# Patient Record
Sex: Female | Born: 1960 | Race: White | Hispanic: No | Smoking: Current every day smoker
Health system: Southern US, Community
[De-identification: ages and names within clinical notes are randomized; demographics above are authoritative.]

## PROBLEM LIST (undated history)

## (undated) DIAGNOSIS — J40 Bronchitis, not specified as acute or chronic: Secondary | ICD-10-CM

## (undated) DIAGNOSIS — I491 Atrial premature depolarization: Secondary | ICD-10-CM

## (undated) DIAGNOSIS — R0789 Other chest pain: Secondary | ICD-10-CM

## (undated) DIAGNOSIS — E119 Type 2 diabetes mellitus without complications: Secondary | ICD-10-CM

## (undated) DIAGNOSIS — I4891 Unspecified atrial fibrillation: Secondary | ICD-10-CM

## (undated) DIAGNOSIS — R0989 Other specified symptoms and signs involving the circulatory and respiratory systems: Secondary | ICD-10-CM

## (undated) DIAGNOSIS — E785 Hyperlipidemia, unspecified: Secondary | ICD-10-CM

## (undated) DIAGNOSIS — R000 Tachycardia, unspecified: Secondary | ICD-10-CM

## (undated) HISTORY — DX: Bronchitis, not specified as acute or chronic: J40

## (undated) HISTORY — DX: Other chest pain: R07.89

## (undated) HISTORY — DX: Atrial premature depolarization: I49.1

## (undated) HISTORY — PX: NO PAST SURGERIES: SHX2092

## (undated) HISTORY — DX: Hyperlipidemia, unspecified: E78.5

## (undated) HISTORY — DX: Type 2 diabetes mellitus without complications: E11.9

## (undated) HISTORY — DX: Tachycardia, unspecified: R00.0

## (undated) HISTORY — DX: Other specified symptoms and signs involving the circulatory and respiratory systems: R09.89

## (undated) HISTORY — DX: Unspecified atrial fibrillation: I48.91

---

## 1997-12-14 ENCOUNTER — Other Ambulatory Visit: Admission: RE | Admit: 1997-12-14 | Discharge: 1997-12-14 | Payer: Self-pay | Admitting: Obstetrics and Gynecology

## 1998-01-11 ENCOUNTER — Ambulatory Visit (HOSPITAL_COMMUNITY): Admission: RE | Admit: 1998-01-11 | Discharge: 1998-01-11 | Payer: Self-pay | Admitting: Obstetrics and Gynecology

## 2001-04-01 ENCOUNTER — Emergency Department (HOSPITAL_COMMUNITY): Admission: EM | Admit: 2001-04-01 | Discharge: 2001-04-01 | Payer: Self-pay | Admitting: Emergency Medicine

## 2001-04-01 ENCOUNTER — Encounter: Payer: Self-pay | Admitting: Emergency Medicine

## 2003-03-27 ENCOUNTER — Other Ambulatory Visit: Admission: RE | Admit: 2003-03-27 | Discharge: 2003-03-27 | Payer: Self-pay | Admitting: Family Medicine

## 2005-11-01 ENCOUNTER — Emergency Department (HOSPITAL_COMMUNITY): Admission: EM | Admit: 2005-11-01 | Discharge: 2005-11-01 | Payer: Self-pay | Admitting: Emergency Medicine

## 2010-05-07 ENCOUNTER — Ambulatory Visit: Payer: Self-pay | Admitting: Dietician

## 2015-01-12 ENCOUNTER — Emergency Department (HOSPITAL_COMMUNITY)
Admission: EM | Admit: 2015-01-12 | Discharge: 2015-01-12 | Disposition: A | Discharge status: Home / Self Care | Payer: BC Managed Care – PPO | Attending: Emergency Medicine | Admitting: Emergency Medicine

## 2015-01-12 ENCOUNTER — Encounter (HOSPITAL_COMMUNITY): Payer: Self-pay

## 2015-01-12 ENCOUNTER — Other Ambulatory Visit: Payer: Self-pay

## 2015-01-12 ENCOUNTER — Emergency Department (HOSPITAL_COMMUNITY): Payer: BC Managed Care – PPO

## 2015-01-12 DIAGNOSIS — R000 Tachycardia, unspecified: Secondary | ICD-10-CM | POA: Diagnosis present

## 2015-01-12 DIAGNOSIS — Z79899 Other long term (current) drug therapy: Secondary | ICD-10-CM | POA: Insufficient documentation

## 2015-01-12 DIAGNOSIS — Z8709 Personal history of other diseases of the respiratory system: Secondary | ICD-10-CM | POA: Diagnosis not present

## 2015-01-12 DIAGNOSIS — I480 Paroxysmal atrial fibrillation: Secondary | ICD-10-CM | POA: Diagnosis not present

## 2015-01-12 DIAGNOSIS — Z720 Tobacco use: Secondary | ICD-10-CM | POA: Insufficient documentation

## 2015-01-12 DIAGNOSIS — F419 Anxiety disorder, unspecified: Secondary | ICD-10-CM | POA: Diagnosis not present

## 2015-01-12 LAB — BASIC METABOLIC PANEL
ANION GAP: 12 (ref 5–15)
BUN: 11 mg/dL (ref 6–20)
CALCIUM: 8.7 mg/dL — AB (ref 8.9–10.3)
CO2: 19 mmol/L — AB (ref 22–32)
Chloride: 107 mmol/L (ref 101–111)
Creatinine, Ser: 0.91 mg/dL (ref 0.44–1.00)
GLUCOSE: 118 mg/dL — AB (ref 65–99)
POTASSIUM: 3.8 mmol/L (ref 3.5–5.1)
Sodium: 138 mmol/L (ref 135–145)

## 2015-01-12 LAB — CBC
HEMATOCRIT: 37.4 % (ref 36.0–46.0)
HEMOGLOBIN: 12.1 g/dL (ref 12.0–15.0)
MCH: 29.2 pg (ref 26.0–34.0)
MCHC: 32.4 g/dL (ref 30.0–36.0)
MCV: 90.1 fL (ref 78.0–100.0)
Platelets: 494 10*3/uL — ABNORMAL HIGH (ref 150–400)
RBC: 4.15 MIL/uL (ref 3.87–5.11)
RDW: 14.4 % (ref 11.5–15.5)
WBC: 16.6 10*3/uL — ABNORMAL HIGH (ref 4.0–10.5)

## 2015-01-12 LAB — I-STAT TROPONIN, ED: TROPONIN I, POC: 0 ng/mL (ref 0.00–0.08)

## 2015-01-12 MED ORDER — DILTIAZEM HCL 25 MG/5ML IV SOLN
10.0000 mg | Freq: Once | INTRAVENOUS | Status: AC
  Administered 2015-01-12: 10 mg via INTRAVENOUS
  Filled 2015-01-12: qty 5

## 2015-01-12 MED ORDER — METOPROLOL TARTRATE 25 MG PO TABS: 100.0000 mg | ORAL_TABLET | Freq: Two times a day (BID) | ORAL | Status: DC | PRN

## 2015-01-12 NOTE — ED Notes (Signed)
She reports 2 week history of bronchitis symptoms and racing heart. She is being treated by her pcp for bronchitis but noticed today she felt her heart racing more so decided to come in. She denies any history of tachycardia. she is A&Ox4, resp e/u

## 2015-01-12 NOTE — Discharge Instructions (Signed)
Atrial Fibrillation °Atrial fibrillation is a type of heartbeat that is irregular or fast (rapid). If you have this condition, your heart keeps quivering in a weird (chaotic) way. This condition can make it so your heart cannot pump blood normally. Having this condition gives a person more risk for stroke, heart failure, and other heart problems. There are different types of atrial fibrillation. Talk with your doctor to learn about the type that you have. °HOME CARE °· Take over-the-counter and prescription medicines only as told by your doctor. °· If your doctor prescribed a blood-thinning medicine, take it exactly as told. Taking too much of it can cause bleeding. If you do not take enough of it, you will not have the protection that you need against stroke and other problems. °· Do not use any tobacco products. These include cigarettes, chewing tobacco, and e-cigarettes. If you need help quitting, ask your doctor. °· If you have apnea (obstructive sleep apnea), manage it as told by your doctor. °· Do not drink alcohol. °· Do not drink beverages that have caffeine. These include coffee, soda, and tea. °· Maintain a healthy weight. Do not use diet pills unless your doctor says they are safe for you. Diet pills may make heart problems worse. °· Follow diet instructions as told by your doctor. °· Exercise regularly as told by your doctor. °· Keep all follow-up visits as told by your doctor. This is important. °GET HELP IF: °· You notice a change in the speed, rhythm, or strength of your heartbeat. °· You are taking a blood-thinning medicine and you notice more bruising. °· You get tired more easily when you move or exercise. °GET HELP RIGHT AWAY IF: °· You have pain in your chest or your belly (abdomen). °· You have sweating or weakness. °· You feel sick to your stomach (nauseous). °· You notice blood in your throw up (vomit), poop (stool), or pee (urine). °· You are short of breath. °· You suddenly have swollen feet  and ankles. °· You feel dizzy. °· Your suddenly get weak or numb in your face, arms, or legs, especially if it happens on one side of your body. °· You have trouble talking, trouble understanding, or both. °· Your face or your eyelid droops on one side. °These symptoms may be an emergency. Do not wait to see if the symptoms will go away. Get medical help right away. Call your local emergency services (911 in the [place] Do not drive yourself to the hospital. °  °This information is not intended to replace advice given to you by your health care provider. Make sure you discuss any questions you have with your health care provider. °  °Document Released: 12/17/2007 Document Revised: 11/28/2014 Document Reviewed: 07/04/2014 °Elsevier Interactive Patient Education ©2016 [employer] Inc[employer] ° °

## 2015-01-12 NOTE — ED Notes (Signed)
Dr. Plunkett at bedside.  

## 2015-01-12 NOTE — ED Provider Notes (Addendum)
CSN: [id number]     Arrival date & time 01/12/15  1135 History   First MD Initiated Contact with Patient 01/12/15 1150     Chief Complaint  Patient presents with  . Tachycardia     (Consider location/radiation/quality/duration/timing/severity/associated sxs/prior Treatment) HPI Comments: Patient states 2 weeks ago she had symptoms consistent with bronchitis and she saw her regular doctor. At that time she was told her heart rate was elevated and that she needed to see a cardiologist but was also treated for bronchitis. Since that time she she had to return to her doctor last week for persistent URI symptoms. At that time she was given Levaquin which she states makes her sick and she's continued to have some chest pressure/congestion and palpitations. Today she felt palpitations and her pulse was elevated and she is Holly Park, in for further evaluation. She denies any productive cough at this time, fever, wheezing or shortness of breath. She states the bronchitis seems to be improving. She does drink 2 Pepsi's a day and was drinking a lot of caffeine but has cut back on her coffee. She denies any over-the-counter medications or stimulants. No prior cardiac issues in the past and has no chronic medical conditions. She does smoke.  Patient is a 54 y.o. female presenting with palpitations. The history is provided by the patient.  Palpitations Palpitations quality:  Fast Onset quality:  Insidious Duration:  2 weeks Timing:  Constant Progression:  Waxing and waning Chronicity:  New   History reviewed. No pertinent past medical history. History reviewed. No pertinent past surgical history. History reviewed. No pertinent family history. Social History  Substance Use Topics  . Smoking status: Current Every Day Smoker -- 0.50 packs/day    Types: Cigarettes  . Smokeless tobacco: None  . Alcohol Use: Yes     Comment: socially   OB History    No data available     Review of Systems   Cardiovascular: Positive for palpitations.  All other systems reviewed and are negative.     Allergies  Review of patient's allergies indicates no known allergies.  Home Medications   Prior to Admission medications   Medication Sig Start Date End Date Taking? Authorizing Provider  levofloxacin (LEVAQUIN) 500 MG tablet Take 500 mg by mouth daily.   Yes Historical Provider, MD   BP 95/53 mmHg  Pulse 82  Temp(Src) 97.9 F (36.6 C) (Oral)  Resp 30  Ht  (1.676 m)  Wt 166 lb (75.297 kg)  BMI 26.81 kg/m2  SpO2 96% Physical Exam  Constitutional: She is oriented to person, place, and time. She appears well-developed and well-nourished. No distress.  Slightly anxious on exam  HENT:  Head: Normocephalic and atraumatic.  Mouth/Throat: Oropharynx is clear and moist.  Eyes: Conjunctivae and EOM are normal. Pupils are equal, round, and reactive to light.  Neck: Normal range of motion. Neck supple.  Cardiovascular: Intact distal pulses.  An irregularly irregular rhythm present. Tachycardia present.   No murmur heard. Pulmonary/Chest: Effort normal and breath sounds normal. No respiratory distress. She has no wheezes. She has no rales.  Abdominal: Soft. She exhibits no distension. There is no tenderness. There is no rebound and no guarding.  Musculoskeletal: Normal range of motion. She exhibits no edema or tenderness.  Neurological: She is alert and oriented to person, place, and time.  Skin: Skin is warm and dry. No rash noted. No erythema.  Psychiatric: She has a normal mood and affect. Her behavior is normal.  Nursing  note and vitals reviewed.   ED Course  Procedures (including critical care time) Labs Review Labs Reviewed  BASIC METABOLIC PANEL - Abnormal; Notable for the following:    CO2 19 (*)    Glucose, Bld 118 (*)    Calcium 8.7 (*)    All other components within normal limits  CBC - Abnormal; Notable for the following:    WBC 16.6 (*)    Platelets 494 (*)     All other components within normal limits  Rosezena SensorI-STAT TROPOININ, ED    Imaging Review Dg Chest Port 1 View  01/12/2015  CLINICAL DATA:  Tachycardia EXAM: PORTABLE CHEST 1 VIEW COMPARISON:  None. FINDINGS: Normal heart size. Subsegmental atelectasis at the bases. Upper lungs clear. No pneumothorax or pleural effusion. IMPRESSION: Subsegmental atelectasis at the bases. Electronically Signed   By: Jolaine ClickArthur  Hoss M.D.   On: 01/12/2015 12:44   I have personally reviewed and evaluated these images and lab results as part of my medical decision-making.   EKG Interpretation   Date/Time:  Saturday January 12 2015 11:42:48 EDT Ventricular Rate:  172 PR Interval:    QRS Duration: 60 QT Interval:  214 QTC Calculation: 362 R Axis:   67 Text Interpretation:  Atrial fibrillation with rapid ventricular response  Cannot rule out Anterior infarct , age undetermined ST \\T \ T wave  abnormality, consider inferior ischemia No previous tracing Confirmed by  Anitra LauthPLUNKETT  MD, Colinda Barth ([id number]) on 01/12/2015 11:50:52 AM      MDM   Final diagnoses:  Paroxysmal atrial fibrillation with RVR (HCC)   patient here with EKG findings for A. fib RVR with intermittent sinus tachycardia that is now been intermittent for the last 2 weeks. When she was seen at her PCPs office she had EKG findings of sinus tachycardia with frequent PACs but no obvious atrial fibrillation. 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 When told by her regular doctor that she had a fast heart rate in need to follow-up with cardiology she cut back on her caffeine and is attempting to stop smoking. She complains of palpitations in the chest tightness and congestion which she often gets with bronchitis but the palpitations are new.  She denies any swelling, risk factors for PE drug or over-the-counter medication stimulant use.  After one dose of  Cardizem patient converted to normal sinus rhythm and has not had recurrent abnormality. Labs other than a leukocytosis of 16,000 of unknown significance are within normal limits. EKGs with some second segmental atelectasis at the bases but no findings of pneumonia. Troponin is within normal limits and patient has no physical exam findings of fluid overload.  Discussed with cardiology and they will see her in follow-up. Patient given metoprolol to take when necessary if palpitations return. She was given strict return precautions and this was discussed with she and her family who are comfortable with the plan.  Holly SproutWhitney Jamilla Galli, MD 01/12/15 1419  Holly SproutWhitney Obie Silos, MD 01/12/15 1422

## 2015-01-25 DIAGNOSIS — J40 Bronchitis, not specified as acute or chronic: Secondary | ICD-10-CM | POA: Insufficient documentation

## 2015-01-25 DIAGNOSIS — R0789 Other chest pain: Secondary | ICD-10-CM | POA: Insufficient documentation

## 2015-01-25 DIAGNOSIS — I4891 Unspecified atrial fibrillation: Secondary | ICD-10-CM | POA: Insufficient documentation

## 2015-01-25 DIAGNOSIS — I491 Atrial premature depolarization: Secondary | ICD-10-CM | POA: Insufficient documentation

## 2015-01-25 DIAGNOSIS — R0989 Other specified symptoms and signs involving the circulatory and respiratory systems: Secondary | ICD-10-CM | POA: Insufficient documentation

## 2015-01-25 DIAGNOSIS — R000 Tachycardia, unspecified: Secondary | ICD-10-CM | POA: Insufficient documentation

## 2015-01-29 ENCOUNTER — Encounter: Payer: Self-pay | Admitting: Cardiovascular Disease

## 2015-01-29 ENCOUNTER — Ambulatory Visit (INDEPENDENT_AMBULATORY_CARE_PROVIDER_SITE_OTHER): Payer: BC Managed Care – PPO | Admitting: Cardiovascular Disease

## 2015-01-29 VITALS — BP 106/74 | HR 85 | Ht 66.0 in | Wt 165.2 lb

## 2015-01-29 DIAGNOSIS — I480 Paroxysmal atrial fibrillation: Secondary | ICD-10-CM | POA: Diagnosis not present

## 2015-01-29 DIAGNOSIS — I4891 Unspecified atrial fibrillation: Secondary | ICD-10-CM | POA: Diagnosis not present

## 2015-01-29 LAB — CBC WITH DIFFERENTIAL/PLATELET
Basophils Absolute: 0 10*3/uL (ref 0.0–0.1)
Basophils Relative: 0 % (ref 0–1)
EOS PCT: 2 % (ref 0–5)
Eosinophils Absolute: 0.2 10*3/uL (ref 0.0–0.7)
HEMATOCRIT: 32.6 % — AB (ref 36.0–46.0)
Hemoglobin: 10.8 g/dL — ABNORMAL LOW (ref 12.0–15.0)
LYMPHS PCT: 29 % (ref 12–46)
Lymphs Abs: 3.1 10*3/uL (ref 0.7–4.0)
MCH: 28.4 pg (ref 26.0–34.0)
MCHC: 33.1 g/dL (ref 30.0–36.0)
MCV: 85.8 fL (ref 78.0–100.0)
MONO ABS: 1.3 10*3/uL — AB (ref 0.1–1.0)
MONOS PCT: 12 % (ref 3–12)
MPV: 8.1 fL — ABNORMAL LOW (ref 8.6–12.4)
Neutro Abs: 6.2 10*3/uL (ref 1.7–7.7)
Neutrophils Relative %: 57 % (ref 43–77)
Platelets: 492 10*3/uL — ABNORMAL HIGH (ref 150–400)
RBC: 3.8 MIL/uL — AB (ref 3.87–5.11)
RDW: 14.4 % (ref 11.5–15.5)
WBC: 10.8 10*3/uL — AB (ref 4.0–10.5)

## 2015-01-29 MED ORDER — PROPRANOLOL HCL 10 MG PO TABS: 10.0000 mg | ORAL_TABLET | Freq: Four times a day (QID) | ORAL | Status: DC | PRN

## 2015-01-29 NOTE — Patient Instructions (Signed)
Medication Instructions:  TAKE Propranolol 10 mg up to 4 times per day as needed   Labwork: TODAY - CBC, TSH   Testing/Procedures: Your physician has requested that you have an echocardiogram. Echocardiography is a painless test that uses sound waves to create images of your heart. It provides your doctor with information about the size and shape of your heart and how well your heart's chambers and valves are working. This procedure takes approximately one hour. There are no restrictions for this procedure.   Follow-Up: Your physician recommends that you schedule a follow-up appointment in: 3 months with Dr. Elease HashimotoNahser.  If you need a refill on your cardiac medications before your next appointment, please call your pharmacy.   Thank you for choosing [facility]! Holly BridegroomMichelle Tiffiny Worthy, RN [phone]

## 2015-01-29 NOTE — Progress Notes (Signed)
Cardiology Office Note   Date:  01/29/2015   ID:  Holly Park, DOB 13-Nov-1960, MRN [id number]  PCP:  Cala Bradford, MD  Cardiologist:   Vesta Mixer, MD   Chief Complaint  Patient presents with  . Atrial Fibrillation   Problem list 1. Paroxysmal atrial fibrillation   History of Present Illness: Holly Park is a 54 y.o. female who presents for further evaluation of her paroxysmal atrial fibrillation. Holly Park was recently seen in the emergency room with an episode of tachycardia. She was found have rapid atrial fibrillation.   She has had problems with bronchitis . Has been found to have an elevated WBC.      She received one dose of IV Cardizem and converted to normal sinus rhythm.  Has been placed on anxiety meds and is feeling better.   She has had PAF in the past .   She's been tried on metoprolol in the past but it caused her heart rate to go down to the 40s.    Past Medical History  Diagnosis Date  . Tachycardia   . New onset a-fib (HCC)   . Atrial fibrillation with RVR (HCC)   . Bronchitis   . PAC (premature atrial contraction)   . Chest tightness   . Chest congestion     Past Surgical History  Procedure Laterality Date  . No past surgeries       Current Outpatient Prescriptions  Medication Sig Dispense Refill  . escitalopram (LEXAPRO) 10 MG tablet Take 10 mg by mouth daily.  5   No current facility-administered medications for this visit.    Allergies:   Metoprolol tartrate    Social History:  The patient  reports that she has been smoking Cigarettes.  She has been smoking about 0.50 packs per day. She does not have any smokeless tobacco history on file. She reports that she drinks alcohol. She reports that she does not use illicit drugs.   Family History:  The patient's family history is not on file.    ROS:  Please see the history of present illness.    Review of Systems: Constitutional:  admits to  fatigue.  HEENT: denies  photophobia, eye pain, redness, hearing loss, ear pain, congestion, sore throat, rhinorrhea, sneezing, neck pain, neck stiffness and tinnitus.  Respiratory: admits to  DOE, cough, chest tightness,   Cardiovascular: admits to   palpitations    Gastrointestinal: denies nausea, vomiting, abdominal pain, diarrhea, constipation, blood in stool.  Genitourinary: denies dysuria, urgency, frequency, hematuria, flank pain and difficulty urinating.  Musculoskeletal: denies  myalgias, back pain, joint swelling, arthralgias and gait problem.   Skin: denies pallor, rash and wound.  Neurological: denies dizziness, seizures, syncope, weakness, light-headedness, numbness and headaches.   Hematological: denies adenopathy, easy bruising, personal or family bleeding history.  Psychiatric/ Behavioral: denies suicidal ideation, mood changes, confusion, nervousness, sleep disturbance and agitation.       All other systems are reviewed and negative.    PHYSICAL EXAM: VS:  BP 106/74 mmHg  Pulse 85  Ht  (1.676 m)  Wt 165 lb 3.2 oz (74.934 kg)  BMI 26.68 kg/m2  SpO2 96% , BMI Body mass index is 26.68 kg/(m^2). GEN: Well nourished, well developed, in no acute distress HEENT: normal Neck: no JVD, carotid bruits, or masses Cardiac: RRR; no murmurs, rubs, or gallops,no edema  Respiratory:  clear to auscultation bilaterally, normal work of breathing GI: soft, nontender, nondistended, + BS MS: no deformity or  atrophy Skin: warm and dry, no rash Neuro:  Strength and sensation are intact Psych: normal   EKG:  EKG is not ordered today. The ekg ordered  In the ER demonstrates  Rapid atrial fib followed by a 2nd ECG that shows NSR .   Recent Labs: 01/12/2015: BUN 11; Creatinine, Ser 0.91; Hemoglobin 12.1; Platelets 494*; Potassium 3.8; Sodium 138    Lipid Panel No results found for: CHOL, TRIG, HDL, CHOLHDL, VLDL, LDLCALC, LDLDIRECT    Wt Readings from Last 3 Encounters:  01/29/15 165 lb 3.2 oz  (74.934 kg)  01/12/15 166 lb (75.297 kg)      Other studies Reviewed: Additional studies/ records that were reviewed today include: . Review of the above records demonstrates:    ASSESSMENT AND PLAN:  1.  Paroxysmal atrial fibrillation:  Holly Park presents today with episodes of paroxysmal atrial fibrillation. She had one episode documented in the emergency room. She converted with one dose of Cardizem. Her CHADS2VASC score is 0.   We discussed the scoring system and that she does not need an anticoagulant at this time.  We discussed the importance of smoking cessation since respiratory issues can contribute to atrial fib.    Will give her Propranolol to take 10 mg as needed. Will see her in 3 months for follow up visit    Current medicines are reviewed at length with the patient today.  The patient does not have concerns regarding medicines.  The following changes have been made:  no change  Labs/ tests ordered today include: TSH, echocardiogram  No orders of the defined types were placed in this encounter.     Disposition:   FU with me in 3 months      Nahser, Deloris PingPhilip J, MD  01/29/2015 8:51 AM    [facility] [address], [place], [place]  [address] Phone: [phone]; Fax: [phone]   [facility] [place] [facility]  [address] [place], [place]  [address] [phone]   Fax [phone]

## 2015-01-31 ENCOUNTER — Other Ambulatory Visit: Payer: Self-pay

## 2015-01-31 ENCOUNTER — Ambulatory Visit (HOSPITAL_COMMUNITY): Payer: BC Managed Care – PPO | Attending: Cardiology

## 2015-01-31 ENCOUNTER — Other Ambulatory Visit: Payer: BC Managed Care – PPO | Admitting: *Deleted

## 2015-01-31 DIAGNOSIS — F17200 Nicotine dependence, unspecified, uncomplicated: Secondary | ICD-10-CM | POA: Insufficient documentation

## 2015-01-31 DIAGNOSIS — I4891 Unspecified atrial fibrillation: Secondary | ICD-10-CM | POA: Diagnosis present

## 2015-01-31 DIAGNOSIS — I480 Paroxysmal atrial fibrillation: Secondary | ICD-10-CM | POA: Diagnosis not present

## 2015-01-31 LAB — TSH: TSH: 1.983 u[IU]/mL (ref 0.350–4.500)

## 2015-05-01 ENCOUNTER — Ambulatory Visit: Payer: BC Managed Care – PPO | Admitting: Cardiovascular Disease

## 2015-05-09 ENCOUNTER — Encounter: Payer: Self-pay | Admitting: Cardiovascular Disease

## 2015-05-09 ENCOUNTER — Ambulatory Visit (INDEPENDENT_AMBULATORY_CARE_PROVIDER_SITE_OTHER): Payer: BC Managed Care – PPO | Admitting: Cardiovascular Disease

## 2015-05-09 VITALS — BP 106/76 | HR 70 | Ht 66.0 in | Wt 176.8 lb

## 2015-05-09 DIAGNOSIS — I4891 Unspecified atrial fibrillation: Secondary | ICD-10-CM | POA: Diagnosis not present

## 2015-05-09 NOTE — Progress Notes (Signed)
Cardiology Office Note   Date:  05/09/2015   ID:  Holly Park, DOB Sep 23, 1960, MRN [id number]  PCP:  Lolita Patella, Park  Cardiologist:   Vesta Mixer, Park   Chief Complaint  Patient presents with  . Atrial Fibrillation   Problem list 1. Paroxysmal atrial fibrillation   History of Present Illness: Holly Park is a 55 y.o. female who presents for further evaluation of her paroxysmal atrial fibrillation. Holly Park was recently seen in the emergency room with an episode of tachycardia. She was found have rapid atrial fibrillation.   She has had problems with bronchitis . Has been found to have an elevated WBC.      She received one dose of IV Cardizem and converted to normal sinus rhythm.  Has been placed on anxiety meds and is feeling better.   She has had PAF in the past .   She's been tried on metoprolol in the past but it caused her heart rate to go down to the 40s.  Feb. 16, 2017:  Doing great.   Has really cut back on her smoking . Is now retired -as of  16 days ago .   Walks regularly    Past Medical History  Diagnosis Date  . Tachycardia   . New onset a-fib (HCC)   . Atrial fibrillation with RVR (HCC)   . Bronchitis   . PAC (premature atrial contraction)   . Chest tightness   . Chest congestion     Past Surgical History  Procedure Laterality Date  . No past surgeries       Current Outpatient Prescriptions  Medication Sig Dispense Refill  . escitalopram (LEXAPRO) 10 MG tablet Take 10 mg by mouth daily.  5  . propranolol (INDERAL) 10 MG tablet Take 1 tablet (10 mg total) by mouth 4 (four) times daily as needed. (Patient not taking: Reported on 05/09/2015) 30 tablet 6   No current facility-administered medications for this visit.    Allergies:   Metoprolol tartrate    Social History:  The patient  reports that she has been smoking Cigarettes.  She has been smoking about 0.50 packs per day. She does not have any smokeless tobacco history  on file. She reports that she drinks alcohol. She reports that she does not use illicit drugs.   Family History:  The patient's family history is not on file.    ROS:  Please see the history of present illness.    Review of Systems: Constitutional:  admits to  fatigue.  HEENT: denies photophobia, eye pain, redness, hearing loss, ear pain, congestion, sore throat, rhinorrhea, sneezing, neck pain, neck stiffness and tinnitus.  Respiratory: admits to  DOE, cough, chest tightness,   Cardiovascular: admits to   palpitations    Gastrointestinal: denies nausea, vomiting, abdominal pain, diarrhea, constipation, blood in stool.  Genitourinary: denies dysuria, urgency, frequency, hematuria, flank pain and difficulty urinating.  Musculoskeletal: denies  myalgias, back pain, joint swelling, arthralgias and gait problem.   Skin: denies pallor, rash and wound.  Neurological: denies dizziness, seizures, syncope, weakness, light-headedness, numbness and headaches.   Hematological: denies adenopathy, easy bruising, personal or family bleeding history.  Psychiatric/ Behavioral: denies suicidal ideation, mood changes, confusion, nervousness, sleep disturbance and agitation.       All other systems are reviewed and negative.    PHYSICAL EXAM: VS:  BP 106/76 mmHg  Pulse 70  Ht  (1.676 m)  Wt 176 lb 12.8 oz (80.196 kg)  BMI 28.55 kg/m2 , BMI Body mass index is 28.55 kg/(m^2). GEN: Well nourished, well developed, in no acute distress HEENT: normal Neck: no JVD, carotid bruits, or masses Cardiac: RRR; no murmurs, rubs, or gallops,no edema  Respiratory:  clear to auscultation bilaterally, normal work of breathing GI: soft, nontender, nondistended, + BS MS: no deformity or atrophy Skin: warm and dry, no rash Neuro:  Strength and sensation are intact Psych: normal   EKG:  EKG is not ordered today. The ekg ordered  In the ER demonstrates  Rapid atrial fib followed by a 2nd ECG that shows NSR .    Recent Labs: 01/12/2015: BUN 11; Creatinine, Ser 0.91; Potassium 3.8; Sodium 138 01/29/2015: Hemoglobin 10.8*; Platelets 492* 01/31/2015: TSH 1.983    Lipid Panel No results found for: CHOL, TRIG, HDL, CHOLHDL, VLDL, LDLCALC, LDLDIRECT    Wt Readings from Last 3 Encounters:  05/09/15 176 lb 12.8 oz (80.196 kg)  01/29/15 165 lb 3.2 oz (74.934 kg)  01/12/15 166 lb (75.297 kg)      Other studies Reviewed: Additional studies/ records that were reviewed today include: . Review of the above records demonstrates:    ASSESSMENT AND PLAN:  1.  Paroxysmal atrial fibrillation:  She's doing very well. She's not had any recurrent episodes of atrial fibrillation. We'll continue with current medications. She's not on any medication.    Her CHADS2VASC score is 0.  " Up to Date" does not recommend ASA therapy any longer for atrial fib    Has not had to take any propranolol   Will see her in 1 year.   Current medicines are reviewed at length with the patient today.  The patient does not have concerns regarding medicines.  The following changes have been made:  no change  Labs/ tests ordered today include:  No orders of the defined types were placed in this encounter.    Disposition:   FU with me in 1 year     Holly Park  05/09/2015 10:38 AM    [facility] [address] [place], [place], [place]  [address] Phone: [phone]; Fax: [phone]   [facility]  [address] [place], [place]  [address] [phone]   Fax [phone]

## 2015-05-09 NOTE — Patient Instructions (Signed)
Medication Instructions:  Your physician recommends that you continue on your current medications as directed. Please refer to the Current Medication list given to you today.   Labwork: None Ordered   Testing/Procedures: None Ordered   Follow-Up: Your physician wants you to follow-up in: 1 year with Dr. Nahser.  You will receive a reminder letter in the mail two months in advance. If you don't receive a letter, please call our office to schedule the follow-up appointment.   If you need a refill on your cardiac medications before your next appointment, please call your pharmacy.   Thank you for choosing [facility]! Apostolos Blagg, RN [phone]    

## 2017-02-12 IMAGING — DX DG CHEST 1V PORT
1 series · 1 of 1 positions shown · non-contrast
Comparison: None.

CLINICAL DATA: Tachycardia

EXAM:
PORTABLE CHEST 1 VIEW

[chest ap]
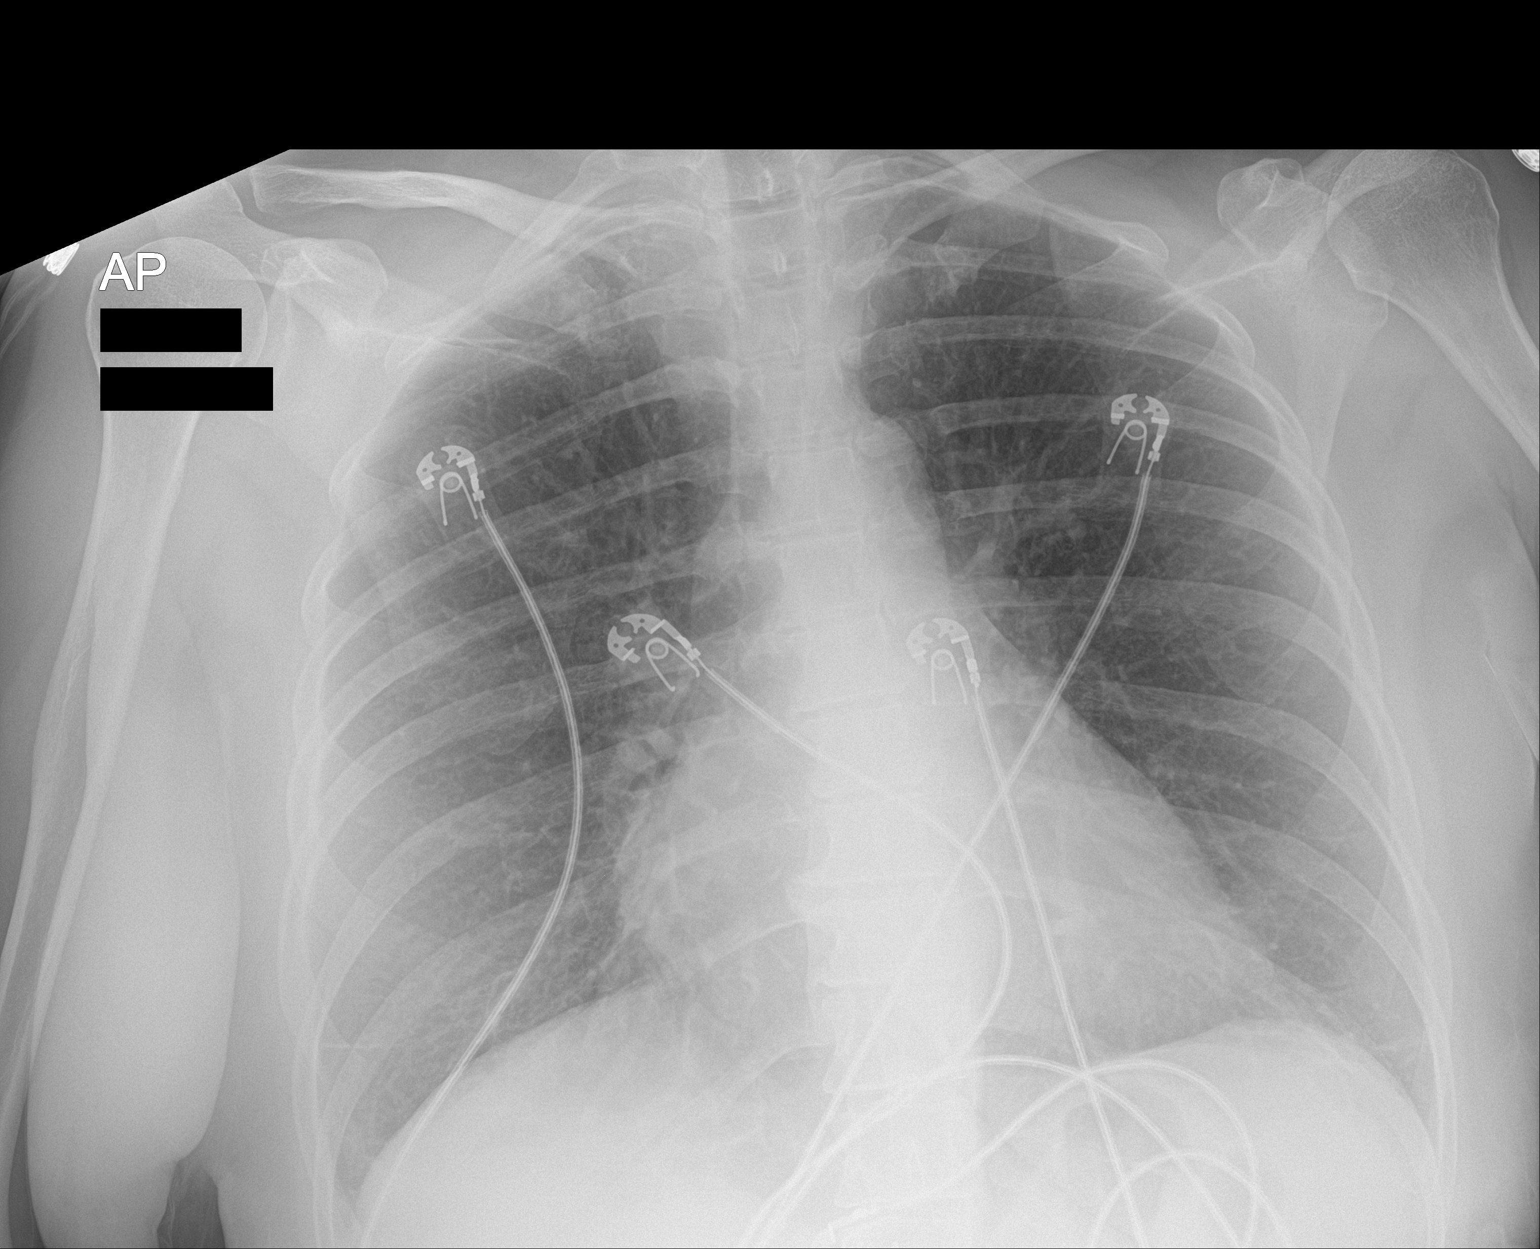

[1 of 1 positions shown; findings below may reference images not displayed]

FINDINGS: Normal heart size. Subsegmental atelectasis at the bases. Upper
lungs clear. No pneumothorax or pleural effusion.
IMPRESSION: Subsegmental atelectasis at the bases.

## 2017-08-20 ENCOUNTER — Other Ambulatory Visit: Payer: Self-pay | Admitting: Family Medicine

## 2017-08-20 DIAGNOSIS — R0989 Other specified symptoms and signs involving the circulatory and respiratory systems: Secondary | ICD-10-CM

## 2017-08-26 ENCOUNTER — Ambulatory Visit
Admission: RE | Admit: 2017-08-26 | Discharge: 2017-08-26 | Disposition: A | Discharge status: Home / Self Care | Payer: BC Managed Care – PPO | Source: Ambulatory Visit | Attending: Family Medicine | Admitting: Family Medicine

## 2017-08-26 DIAGNOSIS — R0989 Other specified symptoms and signs involving the circulatory and respiratory systems: Secondary | ICD-10-CM

## 2018-01-04 ENCOUNTER — Ambulatory Visit: Payer: BC Managed Care – PPO | Admitting: Vascular Surgery

## 2018-01-04 ENCOUNTER — Other Ambulatory Visit: Payer: Self-pay

## 2018-01-04 ENCOUNTER — Encounter: Payer: Self-pay | Admitting: Vascular Surgery

## 2018-01-04 VITALS — BP 103/72 | HR 76 | Temp 97.8°F | Resp 18 | Ht 66.0 in | Wt 170.4 lb

## 2018-01-04 DIAGNOSIS — I771 Stricture of artery: Secondary | ICD-10-CM

## 2018-01-04 NOTE — Progress Notes (Signed)
Vascular and Vein Specialist of   Patient name: Holly Park MRN: [id number] DOB: 05/15/1960 Sex: female  REASON FOR CONSULT: Evaluation of carotid and subclavian stenosis  HPI: Holly Park is a 57 y.o. female, who is today for discussion of abnormal carotid duplex.  She specifically denies any prior history of stroke.  She has no history of ischemic symptoms in her arms.  She does report occasional dizziness.  Had a episode of rapid atrial fibrillation which was associated with use of bronchodilators and this was converted by medication and she is maintaining sinus rhythm.  She was found to have carotid bruit and underwent duplex in June 2019.  I have this for review.  This did show moderate carotid disease bilaterally.  She had stenosis and both subclavian arteries.  She had retrograde flow in her left vertebral artery.  She reports that for some time is been difficult to remain obtain a pulse in her left arm.  Past Medical History:  Diagnosis Date  . Atrial fibrillation with RVR (HCC)   . Bronchitis   . Chest congestion   . Chest tightness   . Diabetes mellitus without complication (HCC)   . Hyperlipidemia   . New onset a-fib (HCC)   . PAC (premature atrial contraction)   . Tachycardia     Family History  Problem Relation Age of Onset  . Heart disease Mother   . Heart disease Sister     SOCIAL HISTORY: Social History   Socioeconomic History  . Marital status: Single    Spouse name: Not on file  . Number of children: Not on file  . Years of education: Not on file  . Highest education level: Not on file  Occupational History  . Not on file  Social Needs  . Financial resource strain: Not on file  . Food insecurity:    Worry: Not on file    Inability: Not on file  . Transportation needs:    Medical: Not on file    Non-medical: Not on file  Tobacco Use  . Smoking status: Current Every Day Smoker    Packs/day: 0.50      Years: 40.00    Pack years: 20.00    Types: Cigarettes  . Smokeless tobacco: Never Used  Substance and Sexual Activity  . Alcohol use: Yes    Comment: socially  . Drug use: No  . Sexual activity: Yes  Lifestyle  . Physical activity:    Days per week: Not on file    Minutes per session: Not on file  . Stress: Not on file  Relationships  . Social connections:    Talks on phone: Not on file    Gets together: Not on file    Attends religious service: Not on file    Active member of club or organization: Not on file    Attends meetings of clubs or organizations: Not on file    Relationship status: Not on file  . Intimate partner violence:    Fear of current or ex partner: Not on file    Emotionally abused: Not on file    Physically abused: Not on file    Forced sexual activity: Not on file  Other Topics Concern  . Not on file  Social History Narrative  . Not on file    Allergies  Allergen Reactions  . Metoprolol Tartrate     DROPPED HEART RATE INTO FOURTY'S.    Current Outpatient Medications  Medication Sig  Dispense Refill  . atorvastatin (LIPITOR) 20 MG tablet Take 20 mg by mouth daily.  1  . escitalopram (LEXAPRO) 10 MG tablet Take 10 mg by mouth daily.  5  . levocetirizine (XYZAL) 5 MG tablet every evening.  1  . propranolol (INDERAL) 10 MG tablet Take 1 tablet (10 mg total) by mouth 4 (four) times daily as needed. (Patient not taking: Reported on 01/04/2018) 30 tablet 6   No current facility-administered medications for this visit.     REVIEW OF SYSTEMS:  [X]  denotes positive finding, [ ]  denotes negative finding Cardiac  Comments:  Chest pain or chest pressure:    Shortness of breath upon exertion:    Short of breath when lying flat:    Irregular heart rhythm: x       Vascular    Pain in calf, thigh, or hip brought on by ambulation:    Pain in feet at night that wakes you up from your sleep:     Blood clot in your veins:    Leg swelling:          Pulmonary    Oxygen at home:    Productive cough:     Wheezing:         Neurologic    Sudden weakness in arms or legs:     Sudden numbness in arms or legs:     Sudden onset of difficulty speaking or slurred speech:    Temporary loss of vision in one eye:     Problems with dizziness:         Gastrointestinal    Blood in stool:     Vomited blood:         Genitourinary    Burning when urinating:     Blood in urine:        Psychiatric    Major depression:         Hematologic    Bleeding problems:    Problems with blood clotting too easily:        Skin    Rashes or ulcers:        Constitutional    Fever or chills:      PHYSICAL EXAM: Vitals:   01/04/18 1405 01/04/18 1411  BP: (!) 154/83 103/72  Pulse: 76   Resp: 18   Temp: 97.8 F (36.6 C)   TempSrc: Oral   SpO2: 96%   Weight: 170 lb 6.4 oz (77.3 kg)   Height: 5\' 6"  (1.676 m)     GENERAL: The patient is a well-nourished female, in no acute distress. The vital signs are documented above. CARDIOVASCULAR: I do not palpate a left radial pulse.  She has a 2+ right radial pulse.  I do not hear carotid or subclavian bruits.  She has easily palpable dorsalis pedis pulses bilaterally PULMONARY: There is good air exchange  ABDOMEN: Soft and non-tender  MUSCULOSKELETAL: There are no major deformities or cyanosis. NEUROLOGIC: No focal weakness or paresthesias are detected. SKIN: There are no ulcers or rashes noted. PSYCHIATRIC: The patient has a normal affect.  DATA:  Carotid duplex reviewed as above with probable occlusion of her proximal left subclavian with retrograde flow in her left vertebral artery  MEDICAL ISSUES: She is asymptomatic regarding this.  I reviewed symptoms of carotid disease with her and she knows to report immediately should this occur.  Also Splane symptoms of arm fatigue associated with her left subclavian occlusion.  She does have mild dizziness but this does not  appear to be related to her  posterior circulation.  We will continue to monitor this.  I did explain the critical importance of smoking cessation.  She is quite young to have this amount of diffuse disease.  Apparently she also reports that her sister had a major heart attack with congestive heart failure and is now on the transplant list.  Discussed her cardiac concerns with her cardiologist and her visit in 1 month.  We will see her again in 6 months for continued follow-up.  I have recommended CT angiogram for more thorough evaluation at that time.  She will notify should she develop any symptoms in the interim   Larina Earthly, MD [facility] Vascular and Vein Specialists of [facility] Tel [phone] Pager [phone]

## 2018-01-31 ENCOUNTER — Ambulatory Visit: Payer: BC Managed Care – PPO | Admitting: Internal Medicine

## 2018-02-04 ENCOUNTER — Ambulatory Visit: Payer: BC Managed Care – PPO | Admitting: Cardiovascular Disease

## 2018-05-12 ENCOUNTER — Encounter: Payer: Self-pay | Admitting: Cardiovascular Disease

## 2018-05-12 ENCOUNTER — Ambulatory Visit: Payer: BC Managed Care – PPO | Admitting: Cardiovascular Disease

## 2018-05-12 ENCOUNTER — Other Ambulatory Visit: Payer: Self-pay | Admitting: *Deleted

## 2018-05-12 VITALS — BP 140/82 | HR 82 | Ht 65.0 in | Wt 176.8 lb

## 2018-05-12 DIAGNOSIS — Z79899 Other long term (current) drug therapy: Secondary | ICD-10-CM | POA: Diagnosis not present

## 2018-05-12 DIAGNOSIS — I4891 Unspecified atrial fibrillation: Secondary | ICD-10-CM

## 2018-05-12 DIAGNOSIS — E781 Pure hyperglyceridemia: Secondary | ICD-10-CM

## 2018-05-12 MED ORDER — FENOFIBRATE 145 MG PO TABS: 145.0000 mg | ORAL_TABLET | Freq: Every day | ORAL | 3 refills | Status: AC

## 2018-05-12 NOTE — Progress Notes (Signed)
Cardiology Office Note   Date:  05/12/2018   ID:  [name], [name] 1960/07/01, MRN [id number]  PCP:  Wilfrid Lund, PA  Cardiologist:   Kristeen Miss, MD   Chief Complaint  Patient presents with  . Atrial Fibrillation   Problem list 1. Paroxysmal atrial fibrillation  Holly Park is a 58 y.o. female who presents for further evaluation of her paroxysmal atrial fibrillation. Holly Park was recently seen in the emergency room with an episode of tachycardia. She was found have rapid atrial fibrillation.   She has had problems with bronchitis . Has been found to have an elevated WBC.      She received one dose of IV Cardizem and converted to normal sinus rhythm.  Has been placed on anxiety meds and is feeling better.   She has had PAF in the past .   She's been tried on metoprolol in the past but it caused her heart rate to go down to the 40s.  Feb. 16, 2017:  Doing great.   Has really cut back on her smoking . Is now retired -as of  16 days ago .   Walks regularly   May 12, 2018:  Holly Park is seen back after a 2 year absence.  She has a history of paroxysmal atrial fibrillation. Was found to have carotid bruit. Also has a lower BP in her left arm Has seen Gretta Began, MD  She is trying to stop smoking.   She has bought nicotine patches.  Her sister has had a large ant MI and is now  He is planning on doing a CT angiogram   Last lipid levels show elevated triglyeride level Chol leves    Past Medical History:  Diagnosis Date  . Atrial fibrillation with RVR (HCC)   . Bronchitis   . Chest congestion   . Chest tightness   . Diabetes mellitus without complication (HCC)   . Hyperlipidemia   . New onset a-fib (HCC)   . PAC (premature atrial contraction)   . Tachycardia     Past Surgical History:  Procedure Laterality Date  . NO PAST SURGERIES       Current Outpatient Medications  Medication Sig Dispense Refill  . atorvastatin (LIPITOR) 20 MG tablet Take  20 mg by mouth daily.  1  . azithromycin (ZITHROMAX) 250 MG tablet TAKE 1 TABLET BY MOUTH EVERY DAY AS DIRECTED FOR 5 DAYS    . escitalopram (LEXAPRO) 10 MG tablet Take 10 mg by mouth daily.  5  . levocetirizine (XYZAL) 5 MG tablet Take 5 mg by mouth every evening.   1  . ONE TOUCH ULTRA TEST test strip     . propranolol (INDERAL) 10 MG tablet Take 10 mg by mouth 4 (four) times daily as needed.     No current facility-administered medications for this visit.     Allergies:   Metoprolol tartrate    Social History:  The patient  reports that she has been smoking cigarettes. She has a 20.00 pack-year smoking history. She has never used smokeless tobacco. She reports current alcohol use. She reports that she does not use drugs.   Family History:  The patient's family history includes Heart disease in her mother and sister.    ROS:  Please see the history of present illness.       Physical Exam: Blood pressure 140/82, pulse 82, height 5\' 5"  (1.651 m), weight 176 lb 12.8 oz (80.2 kg), SpO2 97 %.  GEN:  Well nourished, well developed in no acute distress HEENT: Normal NECK: No JVD; right carotid bruit  LYMPHATICS: No lymphadenopathy CARDIAC: RRR   RESPIRATORY:  Clear to auscultation without rales, wheezing or rhonchi  ABDOMEN: Soft, non-tender, non-distended MUSCULOSKELETAL:  No edema; No deformity  SKIN: Warm and dry NEUROLOGIC:  Alert and oriented x 3 l   EKG:   May 12, 2018: Normal sinus rhythm at 66 beats minute.  Normal EKG.  Recent Labs: No results found for requested labs within last 8760 hours.    Lipid Panel No results found for: CHOL, TRIG, HDL, CHOLHDL, VLDL, LDLCALC, LDLDIRECT    Wt Readings from Last 3 Encounters:  05/12/18 176 lb 12.8 oz (80.2 kg)  01/04/18 170 lb 6.4 oz (77.3 kg)  05/09/15 176 lb 12.8 oz (80.2 kg)      Other studies Reviewed: Additional studies/ records that were reviewed today include: . Review of the above records  demonstrates:    ASSESSMENT AND PLAN:  1.  Paroxysmal atrial fibrillation:   Not had any recurrent episodes of paroxysmal atrial fibrillation.  .  Peripheral vascular disease: The patient has a loud right carotid bruit.  Carotid duplex scan reveals bilateral mild to moderate rotted artery disease. He has already seen Dr. Arbie Cookey.  She has been found to have a left subclavian stenosis / occlusion  as well. He has scheduled her for a CT angiogram of the chest for further evaluation of her peripheral vascular disease.  He is interested in going ahead and getting the CT angiogram for further evaluation. At present she is asymptomatic and has good pulses in her left hand.  She has documented retrograde flow in the left vertebral artery.  We will have her follow-up with Dr. Arbie Cookey for further evaluation and management of her peripheral vascular disease.  We will try to make sure that her lipid levels are optimized.  We spent quite a bit of time talking about  smoking cessation.  3.  Hyperlipidemia: Her cholesterol levels are fairly normal.  Her triglyceride level remains elevated.  We will start her on fenofibrate 145 mg a day.  I encouraged her to greatly decreased the carbohydrates in her diet.   Current medicines are reviewed at length with the patient today.  The patient does not have concerns regarding medicines.  The following changes have been made:  no change  Labs/ tests ordered today include:  No orders of the defined types were placed in this encounter.   Disposition:   FU with me in 1 year     Kristeen Miss, MD  05/12/2018 9:09 AM    [facility] [address] [place], [place], [place]  [address] Phone: [phone]; Fax: [phone]   [facility]  [address] [place], [place]  [address] [phone]   Fax [phone]

## 2018-05-12 NOTE — Patient Instructions (Addendum)
Medication Instructions.. Start Fenofibrate 145mg  once a day  If you need a refill on your cardiac medications before your next appointment, please call your pharmacy.   Lab work: CMET and Lipid in 3 months... fasting   If you have labs (blood work) drawn today and your tests are completely normal, you will receive your results only by: Marland Kitchen MyChart Message (if you have MyChart) OR . A paper copy in the mail If you have any lab test that is abnormal or we need to change your treatment, we will call you to review the results.  Testing/Procedures: None ordered  Follow-Up: Your physician wants you to follow-up in: one year with Dr. Elease Hashimoto.Marland KitchenMarland KitchenYou will receive a reminder letter in the mail two months in advance. If you don't receive a letter, please call our office to schedule the follow-up appointment.   At [facility], you and your health needs are our priority.  As part of our continuing mission to provide you with exceptional heart care, we have created designated Provider Care Teams.  These Care Teams include your primary Cardiologist (physician) and Advanced Practice Providers (APPs -  Physician Assistants and Nurse Practitioners) who all work together to provide you with the care you need, when you need it.   Any Other Special Instructions Will Be Listed Below (If Applicable).  '

## 2018-07-24 IMAGING — US US CAROTID DUPLEX BILAT
1 series · 13 of 24 positions shown · non-contrast
Comparison: None.

CLINICAL DATA: Right neck bruit

EXAM:
BILATERAL CAROTID DUPLEX ULTRASOUND
TECHNIQUE: Gray scale imaging, color Doppler and duplex ultrasound were
performed of bilateral carotid and vertebral arteries in the neck.

[Series 1: us carotid duplex bilat · 0.06mm/px · 13 of 66 slices shown]
[im 1/66]
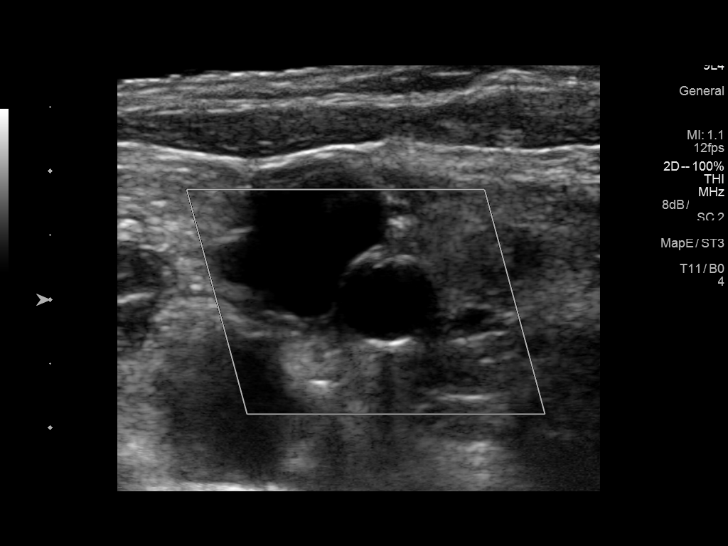
[im 6/66]
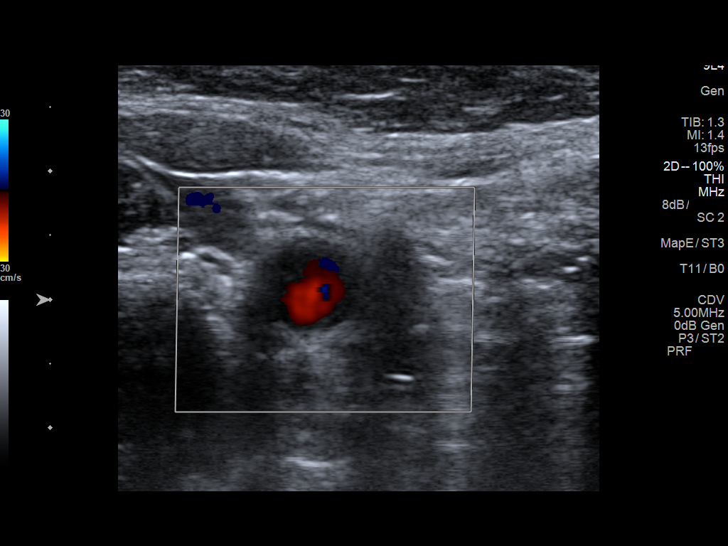
[im 12/66]
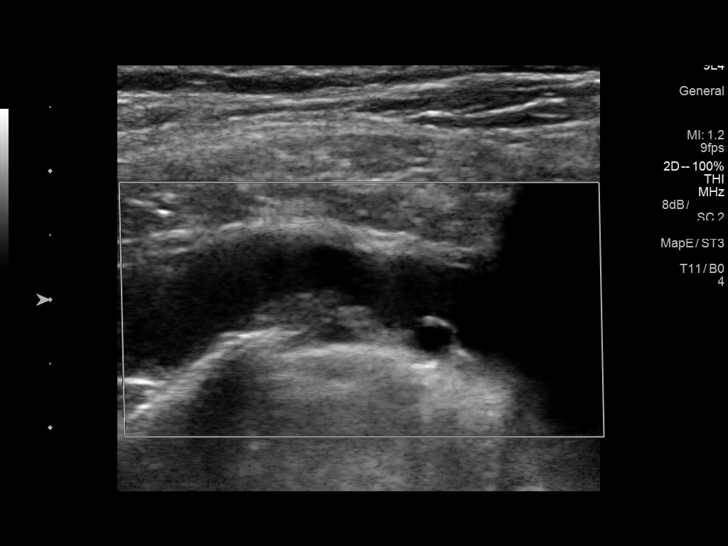
[im 17/66]
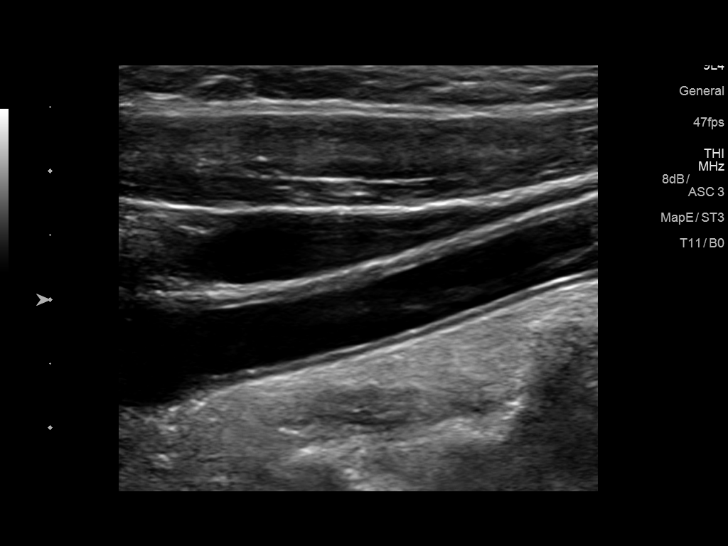
[im 23/66]
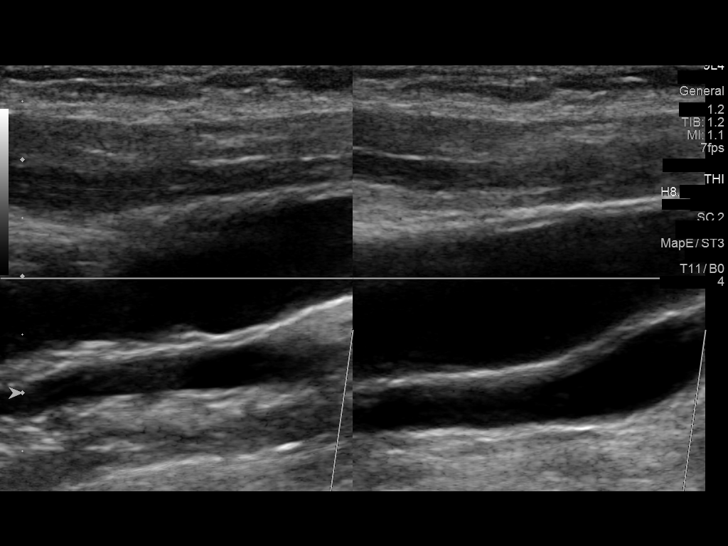
[im 29/66]
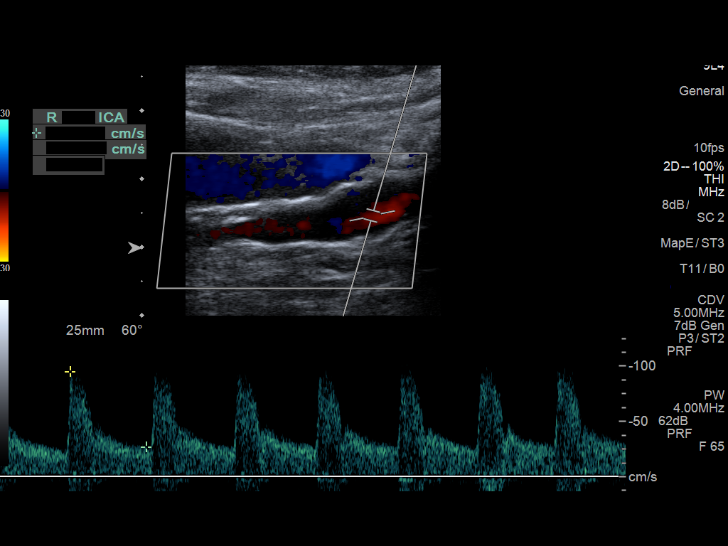
[im 34/66]
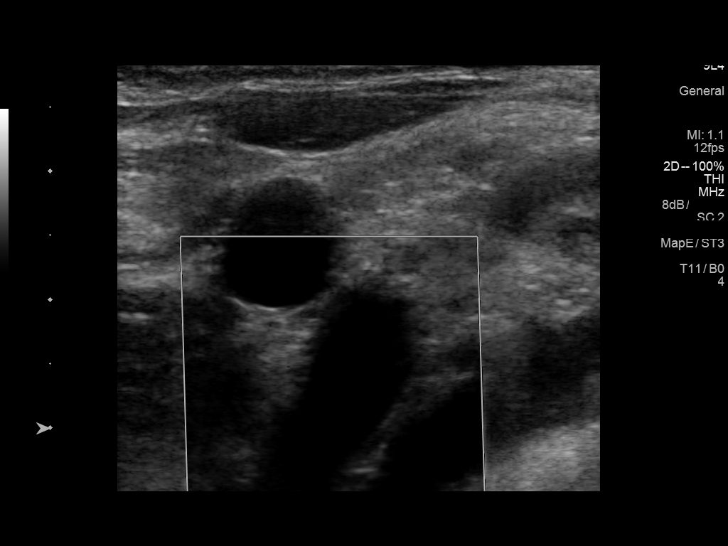
[im 37/66]
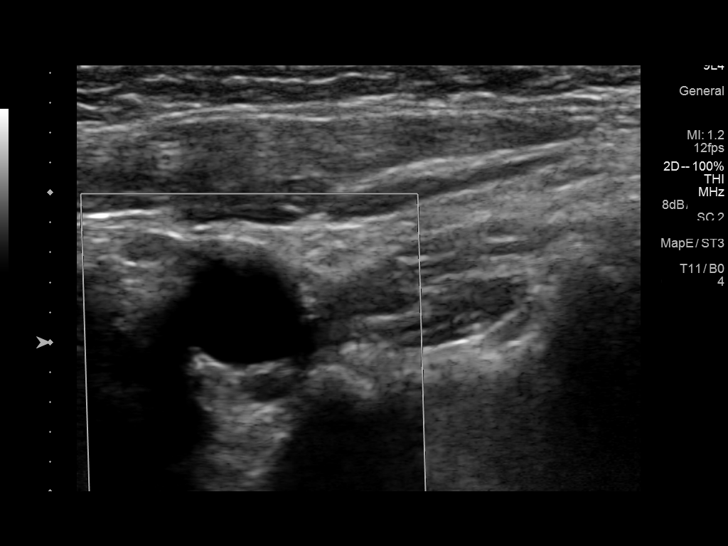
[im 43/66]
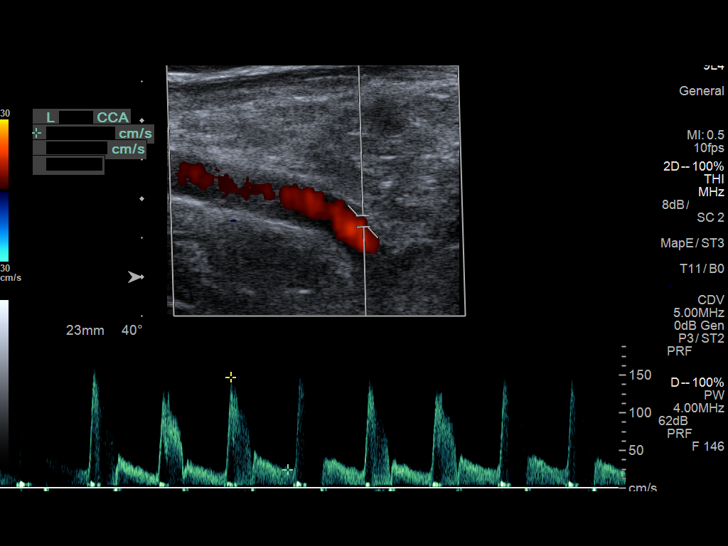
[im 49/66]
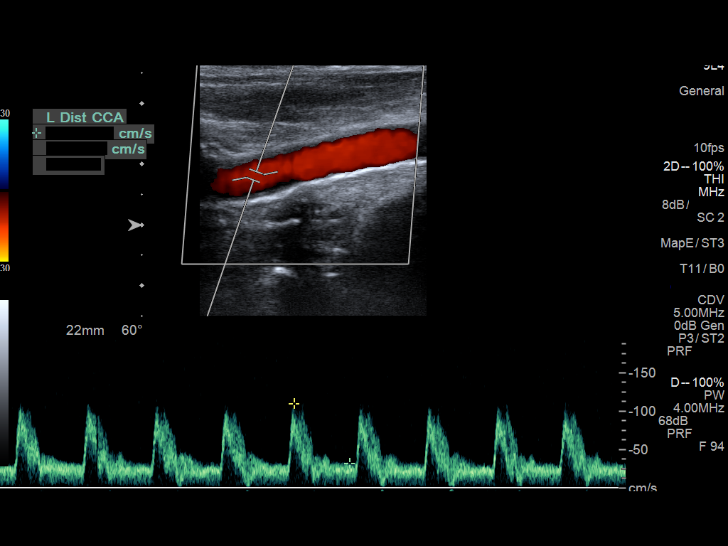
[im 54/66]
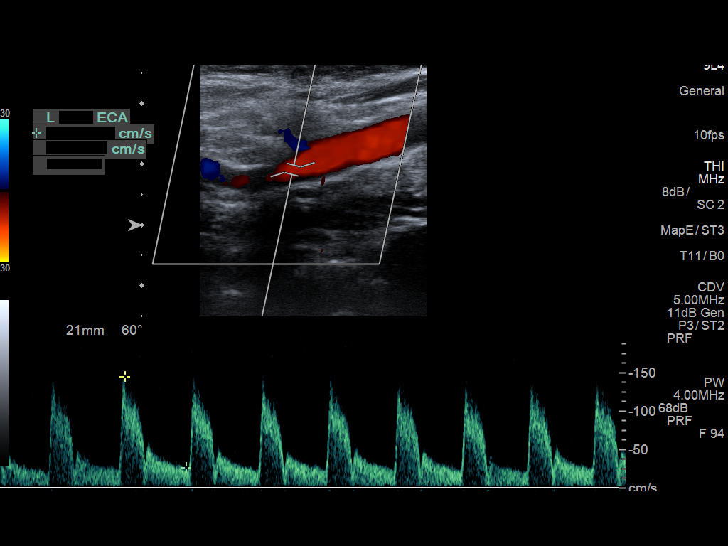
[im 60/66]
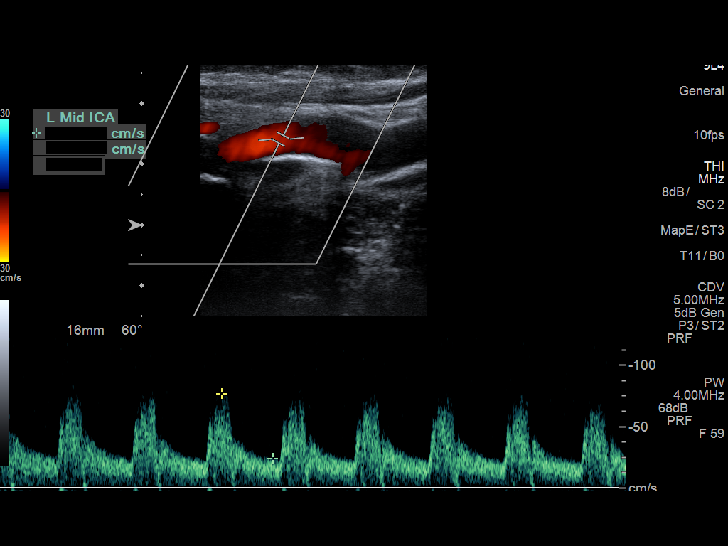
[im 66/66]
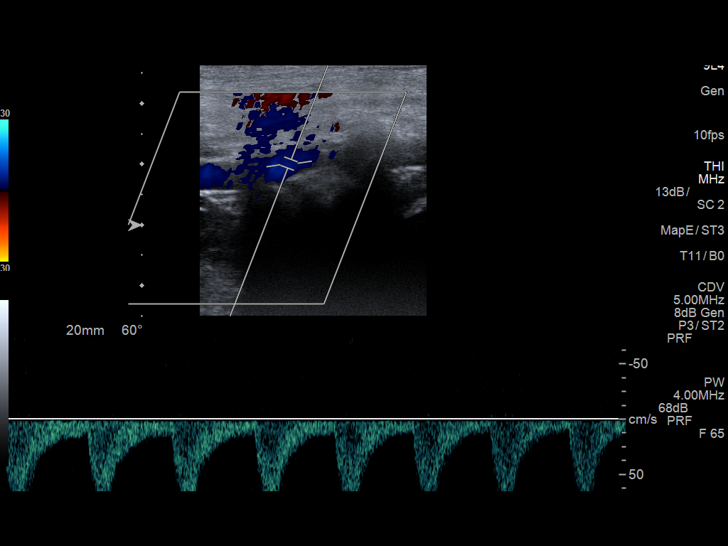

[13 of 24 positions shown; findings below may reference images not displayed]

FINDINGS: Criteria: Quantification of carotid stenosis is based on velocity
parameters that correlate the residual internal carotid diameter
with NASCET-based stenosis levels, using the diameter of the distal
internal carotid lumen as the denominator for stenosis measurement.

The following velocity measurements were obtained:

RIGHT

ICA:  95 cm/sec

CCA:  202 cm/sec

SYSTOLIC ICA/CCA RATIO:

DIASTOLIC ICA/CCA RATIO:

ECA:  93 cm/sec

LEFT

ICA:  87 cm/sec

CCA:  147 cm/sec

SYSTOLIC ICA/CCA RATIO:

DIASTOLIC ICA/CCA RATIO:

ECA:  145 cm/sec

RIGHT CAROTID ARTERY: Minimal intimal thickening in the bulb. Low
resistance internal carotid Doppler pattern is preserved

RIGHT VERTEBRAL ARTERY:  Antegrade.

RIGHT SUBCLAVIAN ARTERY: Abnormal monophasic low resistance pattern.
Maximal velocity is 347 centimeters/second.

LEFT CAROTID ARTERY: Minimal intimal thickening in the bulb. Low
resistance internal carotid Doppler pattern is preserved.

LEFT VERTEBRAL ARTERY:  Retrograde.

LEFT SUBCLAVIAN ARTERY: Abnormal monophasic waveform. Maximal
velocity is 44 centimeters/second.

Please note that the right and left upper extremity systolic blood
pressures are 134 and 83 mm Hg respectively.
IMPRESSION: Less than 50% stenosis in the right and left internal carotid
arteries.

Abnormal right subclavian artery Doppler waveform suggesting
proximal stenosis.

Abnormal left subclavian artery Doppler waveforms suggesting
proximal stenosis.

Retrograde flow in the left vertebral artery consistent with left
subclavian steal syndrome.

## 2018-08-10 ENCOUNTER — Other Ambulatory Visit: Payer: BC Managed Care – PPO

## 2019-01-05 ENCOUNTER — Other Ambulatory Visit: Payer: Self-pay | Admitting: Vascular Surgery

## 2019-01-05 DIAGNOSIS — I771 Stricture of artery: Secondary | ICD-10-CM

## 2019-01-18 ENCOUNTER — Ambulatory Visit
Admission: RE | Admit: 2019-01-18 | Discharge: 2019-01-18 | Disposition: A | Discharge status: Home / Self Care | Payer: BC Managed Care – PPO | Source: Ambulatory Visit | Attending: Vascular Surgery | Admitting: Vascular Surgery

## 2019-01-18 DIAGNOSIS — I771 Stricture of artery: Secondary | ICD-10-CM

## 2019-01-18 MED ORDER — IOPAMIDOL (ISOVUE-370) INJECTION 76%
75.0000 mL | Freq: Once | INTRAVENOUS | Status: AC | PRN
  Administered 2019-01-18: 12:00:00 75 mL via INTRAVENOUS

## 2019-01-20 ENCOUNTER — Telehealth: Payer: Self-pay | Admitting: *Deleted

## 2019-01-23 NOTE — Telephone Encounter (Signed)
Virtual Visit Pre-Appointment Phone Call  Today, I spoke with Holly Park and performed the following actions:  1. I explained that we are currently trying to limit exposure to the COVID-19 virus by seeing patients at home rather than in the office.  I explained that the visits are best done by video, but can be done by telephone.  I asked the patient if a virtual visit that the patient would like to try instead of coming into the office. Holly Park agreed to proceed with the virtual visit scheduled with Curt Jews MD on 01/24/19.     2. I confirmed the BEST phone number to call the day of the visit and- I included this in appointment notes.  3. I asked if the patient had access to (through a family member/friend) a smartphone with video capability to be used for her visit?"  The patient said yes -    4. I confirmed consent by  a. sending through [facility] or by email the [place] as written at the end of this message or  b. verbally as listed below. i. This visit is being performed in the setting of COVID-19. ii. All virtual visits are billed to your insurance company just like a normal visit would be.   iii. We'd like you to understand that the technology does not allow for your provider to perform an examination, and thus may limit your provider's ability to fully assess your condition.  iv. If your provider identifies any concerns that need to be evaluated in person, we will make arrangements to do so.   v. Finally, though the technology is pretty good, we cannot assure that it will always work on either your or our end, and in the setting of a video visit, we may have to convert it to a phone-only visit.  In either situation, we cannot ensure that we have a secure connection.   vi. Are you willing to proceed?"  STAFF: Did the patient verbally acknowledge consent to telehealth visit? Document YES/NO here: YES  2. I advised the patient to be  prepared - I asked that the patient, on the day of her visit, record any information possible with the equipment at her home, such as blood pressure, pulse, oxygen saturation, and your weight and write them all down. I asked the patient to have a pen and paper handy nearby the day of the visit as well.  3. If the patient was scheduled for a video visit, I informed the patient that the visit with the doctor would start with a text to the smartphone # given to [place] by the patient.         If the patient was scheduled for a telephone call, I informed the patient that the visit with the doctor would start with a call to the telephone # given to [place] by the patient.  4. I Informed patient they will receive a phone call 15 minutes prior to their appointment time from a [place] or nurse to review medications, allergies, etc. to prepare for the visit.    TELEPHONE CALL NOTE  Holly Park has been deemed a candidate for a follow-up tele-health visit to limit community exposure during the Covid-19 pandemic. I spoke with the patient via phone to ensure availability of phone/video source, confirm preferred email & phone number, and discuss instructions and expectations.  I reminded Holly Park to be prepared with any vital sign  and/or heart rhythm information that could potentially be obtained via home monitoring, at the time of her visit. I reminded Holly Park to expect a phone call prior to her visit.  Rudi Coco, NT 01/23/2019 12:52 PM     FULL LENGTH CONSENT FOR TELE-HEALTH VISIT   I hereby voluntarily request, consent and authorize [facility] and its employed or contracted physicians, physician assistants, nurse practitioners or other licensed health care professionals (the Practitioner), to provide me with telemedicine health care services (the "Services") as deemed necessary by the treating Practitioner. I acknowledge and consent to receive the Services by the Practitioner via  telemedicine. I understand that the telemedicine visit will involve communicating with the Practitioner through live audiovisual communication technology and the disclosure of certain medical information by electronic transmission. I acknowledge that I have been given the opportunity to request an in-person assessment or other available alternative prior to the telemedicine visit and am voluntarily participating in the telemedicine visit.  I understand that I have the right to withhold or withdraw my consent to the use of telemedicine in the course of my care at any time, without affecting my right to future care or treatment, and that the Practitioner or I may terminate the telemedicine visit at any time. I understand that I have the right to inspect all information obtained and/or recorded in the course of the telemedicine visit and may receive copies of available information for a reasonable fee.  I understand that some of the potential risks of receiving the Services via telemedicine include:  Marland Kitchen Delay or interruption in medical evaluation due to technological equipment failure or disruption; . Information transmitted may not be sufficient (e.g. poor resolution of images) to allow for appropriate medical decision making by the Practitioner; and/or  . In rare instances, security protocols could fail, causing a breach of personal health information.  Furthermore, I acknowledge that it is my responsibility to provide information about my medical history, conditions and care that is complete and accurate to the best of my ability. I acknowledge that Practitioner's advice, recommendations, and/or decision may be based on factors not within their control, such as incomplete or inaccurate data provided by me or distortions of diagnostic images or specimens that may result from electronic transmissions. I understand that the practice of medicine is not an exact science and that Practitioner makes no warranties or  guarantees regarding treatment outcomes. I acknowledge that I will receive a copy of this consent concurrently upon execution via email to the email address I last provided but may also request a printed copy by calling the office of [facility].    I understand that my insurance will be billed for this visit.   I have read or had this consent read to me. . I understand the contents of this consent, which adequately explains the benefits and risks of the Services being provided via telemedicine.  . I have been provided ample opportunity to ask questions regarding this consent and the Services and have had my questions answered to my satisfaction. . I give my informed consent for the services to be provided through the use of telemedicine in my medical care  By participating in this telemedicine visit I agree to the above.

## 2019-01-24 ENCOUNTER — Encounter: Payer: Self-pay | Admitting: Vascular Surgery

## 2019-01-24 ENCOUNTER — Ambulatory Visit (INDEPENDENT_AMBULATORY_CARE_PROVIDER_SITE_OTHER): Payer: BC Managed Care – PPO | Admitting: Vascular Surgery

## 2019-01-24 ENCOUNTER — Other Ambulatory Visit: Payer: Self-pay

## 2019-01-24 DIAGNOSIS — I771 Stricture of artery: Secondary | ICD-10-CM

## 2019-01-24 NOTE — Progress Notes (Signed)
Virtual Visit via Telephone Note    I connected with Holly Park on 01/24/2019  by telephone and verified that I was speaking with the correct person using two identifiers. Patient was located at home and accompanied by no one. I am located at [facility].   The limitations of evaluation and management by telemedicine and the availability of in person appointments have been previously discussed with the patient and are documented in the patients chart. The patient expressed understanding and consented to proceed.  PCP: Lois Huxley, PA   Chief Complaint: Abnormal subclavian artery  History of Present Illness: Holly Park is a 58 y.o. female with known history of left subclavian artery occlusion.  I had seen her 1 year ago with duplex.  She had been found to have a carotid bruit and duplex showed left subclavian occlusion with retrograde flow in her left vertebral artery.  It also suggested possible carotid moderate stenosis bilaterally.  I recommended follow-up with a CT scan at 6 months.  It is actually been 1 year since I saw her.  She has had no neurologic deficits.  She does report occasional dizziness when she arises quickly but this is easily tolerated.  She has no symptoms of fatigue in her left arm.  He has no new cardiac difficulties.  She reports that her sisters continue to have difficulty at an Frankey Botting age and is recently had an LVAD placement  Past Medical History:  Diagnosis Date  . Atrial fibrillation with RVR ([place])   . Bronchitis   . Chest congestion   . Chest tightness   . Diabetes mellitus without complication ([place])   . Hyperlipidemia   . New onset a-fib ([place])   . PAC (premature atrial contraction)   . Tachycardia     Past Surgical History:  Procedure Laterality Date  . NO PAST SURGERIES      Current Meds  Medication Sig  . aspirin EC 81 MG tablet Take 81 mg by mouth daily.  Marland Kitchen atorvastatin (LIPITOR) 20 MG tablet Take 20 mg by mouth daily.  Marland Kitchen  escitalopram (LEXAPRO) 10 MG tablet Take 10 mg by mouth daily.  . fenofibrate (TRICOR) 145 MG tablet Take 1 tablet (145 mg total) by mouth daily.  . fluticasone (FLONASE) 50 MCG/ACT nasal spray SMARTSIG:2 Spray(s) Both Nares Every Night  . levocetirizine (XYZAL) 5 MG tablet Take 5 mg by mouth every evening.   . ONE TOUCH ULTRA TEST test strip     12 system ROS was negative unless otherwise noted in HPI   Observations/Objective: She underwent a CT scan on 01/18/2019.  I reviewed the images and discussed these in detail with the patient.  This does show occlusion of her left subclavian artery distal to the takeoff.  She does have flow in her left vertebral artery presumably retrograde by CT.  She does not have any evidence of significant carotid disease bilaterally.  She does have mild narrowing of the origin of her right vertebral artery  Assessment and Plan:  Stable CT scan compared to duplex from 1 year ago.  She has had no symptoms of left arm ischemia and no evidence of posterior circulation difficulties.  I would not recommend any further evaluation and would not recommend follow-up unless she develops new symptoms      I discussed the assessment and treatment plan with the patient. The patient was provided an opportunity to ask questions and all were answered. The patient agreed with the  plan and demonstrated an understanding of the instructions.   The patient was advised to call back or seek an in-person evaluation if the symptoms worsen or if the condition fails to improve as anticipated.  I spent 15 minutes with the patient via telephone encounter including review of films.   Milagros Loll [facility] Office: [phone]  01/24/2019, 12:03 PM

## 2019-07-14 ENCOUNTER — Ambulatory Visit: Payer: BC Managed Care – PPO | Admitting: Cardiovascular Disease

## 2019-07-27 ENCOUNTER — Ambulatory Visit: Payer: BC Managed Care – PPO | Admitting: Physician Assistant

## 2019-07-27 ENCOUNTER — Other Ambulatory Visit: Payer: Self-pay

## 2019-07-27 ENCOUNTER — Encounter: Payer: Self-pay | Admitting: Physician Assistant

## 2019-07-27 VITALS — BP 142/90 | HR 83 | Ht 65.0 in | Wt 177.8 lb

## 2019-07-27 DIAGNOSIS — I480 Paroxysmal atrial fibrillation: Secondary | ICD-10-CM | POA: Diagnosis not present

## 2019-07-27 DIAGNOSIS — Z720 Tobacco use: Secondary | ICD-10-CM

## 2019-07-27 DIAGNOSIS — E781 Pure hyperglyceridemia: Secondary | ICD-10-CM

## 2019-07-27 DIAGNOSIS — R030 Elevated blood-pressure reading, without diagnosis of hypertension: Secondary | ICD-10-CM

## 2019-07-27 DIAGNOSIS — R0609 Other forms of dyspnea: Secondary | ICD-10-CM

## 2019-07-27 DIAGNOSIS — R0600 Dyspnea, unspecified: Secondary | ICD-10-CM

## 2019-07-27 DIAGNOSIS — I771 Stricture of artery: Secondary | ICD-10-CM | POA: Diagnosis not present

## 2019-07-27 NOTE — Progress Notes (Signed)
Cardiology Office Note    Date:  07/27/2019   ID:  Holly Park, DOB [birth year], MRN [id number]  PCP:  Wilfrid Lund, PA  Cardiologist:  Dr. Elease Hashimoto  Chief Complaint: 15  Months follow up  History of Present Illness:   Holly Park is a 59 y.o. female with history of paroxysmal atrial fibrillation,  left subclavian stenosis followed by Dr. Arbie Cookey, ongoing tobacco smoking and hyperlipidemia presents for follow-up.  Dx with afib in 2016 in setting of bronchitis. Patient was found to have carotid bruit in 2019.  Follow-up carotid Doppler in 2019  showed left subclavian occlusion with retrograde Park in the left vertebral artery.  There was possible moderate carotid arterial disease bilaterally.  She established care with Dr. Arbie Cookey, last seen 01/2019.  She underwent CT angio of neck prior to appointment which showed occlusion of left subclavian artery distal to takeoff.  Stable scan. "She does not have any evidence of significant carotid disease bilaterally.  She does have mild narrowing of the origin of her right vertebral artery".  No evidence of left arm ischemia or posterior circulation.  No work-up recommended unless symptomatic.  Here today for follow-up.  Since pandemic patient has started smoking more than usual.  Currently smoking little less than 1 pack a day.  Patient reports shortness of breath with extreme walking which resolved with rest and catching breath.  No exertional chest discomfort.  Denies palpitation, orthopnea, PND, lower extremity edema, melena or blood in her stool or urine.  Sister had MI at age 11 and requiring LVAD at age 53. Maternal grandfather and maternal uncle has pacemaker.  Past Medical History:  Diagnosis Date  . Atrial fibrillation with RVR ([facility])   . Bronchitis   . Chest congestion   . Chest tightness   . Diabetes mellitus without complication ([facility])   . Hyperlipidemia   . New onset a-fib ([facility])   . PAC (premature atrial contraction)   .  Tachycardia     Past Surgical History:  Procedure Laterality Date  . NO PAST SURGERIES      Current Medications: Prior to Admission medications   Medication Sig Start Date End Date Taking? Authorizing Provider  aspirin EC 81 MG tablet Take 81 mg by mouth daily.    [provider]  atorvastatin (LIPITOR) 20 MG tablet Take 20 mg by mouth daily. 12/03/17   [provider]  escitalopram (LEXAPRO) 10 MG tablet Take 10 mg by mouth daily. 01/15/15   [provider]  fenofibrate (TRICOR) 145 MG tablet Take 1 tablet (145 mg total) by mouth daily. 05/12/18   Pricilla Riffle, MD  fluticasone [facility]) 50 MCG/ACT nasal spray SMARTSIG:2 Spray(s) Both Nares Every Night 12/07/18   [provider]  levocetirizine (XYZAL) 5 MG tablet Take 5 mg by mouth every evening.  11/12/17   [provider]  ONE TOUCH ULTRA TEST test strip  02/28/18   [provider]    Allergies:   Metoprolol tartrate   Social History   Socioeconomic History  . Marital status: Single    Spouse name: Not on file  . Number of children: Not on file  . Years of education: Not on file  . Highest education level: Not on file  Occupational History  . Not on file  Tobacco Use  . Smoking status: Current Every Day Smoker    Packs/day: 0.50    Years: 40.00    Pack years: 20.00    Types: Cigarettes  .  Smokeless tobacco: Never Used  Substance and Sexual Activity  . Alcohol use: Yes    Comment: socially  . Drug use: No  . Sexual activity: Yes  Other Topics Concern  . Not on file  Social History Narrative  . Not on file   Social Determinants of Health   Financial Resource Strain:   . Difficulty of Paying Living Expenses:   Food Insecurity:   . Worried About Programme researcher, broadcasting/film/video in the Last Year:   . Barista in the Last Year:   Transportation Needs:   . Freight forwarder (Medical):   Marland Kitchen Lack of Transportation (Non-Medical):   Physical Activity:   . Days of  Exercise per Week:   . Minutes of Exercise per Session:   Stress:   . Feeling of Stress :   Social Connections:   . Frequency of Communication with Friends and Family:   . Frequency of Social Gatherings with Friends and Family:   . Attends Religious Services:   . Active Member of Clubs or Organizations:   . Attends Banker Meetings:   Marland Kitchen Marital Status:      Family History:  The patient's family history includes Heart disease in her mother and sister.   ROS:   Please see the history of present illness.    ROS All other systems reviewed and are negative.   PHYSICAL EXAM:   VS:  BP (!) 142/90   Pulse 83   Ht 5\' 5"  (1.651 m)   Wt 177 lb 12.8 oz (80.6 kg)   BMI 29.59 kg/m    GEN: Well nourished, well developed, in no acute distress  HEENT: normal  Neck: no JVD, carotid bruits, or masses Cardiac:RRR; no murmurs, rubs, or gallops,no edema  Respiratory:  clear to auscultation bilaterally, normal work of breathing GI: soft, nontender, nondistended, + BS MS: no deformity or atrophy  Skin: warm and dry, no rash Neuro:  Alert and Oriented x 3, Strength and sensation are intact Psych: euthymic mood, full affect  Wt Readings from Last 3 Encounters:  07/27/19 177 lb 12.8 oz (80.6 kg)  05/12/18 176 lb 12.8 oz (80.2 kg)  01/04/18 170 lb 6.4 oz (77.3 kg)      Studies/Labs Reviewed:   EKG:  EKG is ordered today.  The ekg ordered today demonstrates normal sinus rhythm at rate of 83 bpm, PVC  Recent Labs: No results found for requested labs within last 8760 hours.   Lipid Panel No results found for: CHOL, TRIG, HDL, CHOLHDL, VLDL, LDLCALC, LDLDIRECT  Additional studies/ records that were reviewed today include:   CT angio of neck 12/2018 IMPRESSION: 1. Occlusion of the left subclavian artery 10 mm from its origin. 2. The left subclavian artery is reconstituted just proximal to the vertebral artery, consistent with subclavian steal syndrome. 3. Mild  atherosclerotic changes at the left carotid bifurcation without significant stenosis. 4. 50% stenosis at the origin of the right vertebral artery.   Carotid doppler 08/2017 IMPRESSION: Less than 50% stenosis in the right and left internal carotid arteries.  Abnormal right subclavian artery Doppler waveform suggesting proximal stenosis.  Abnormal left subclavian artery Doppler waveforms suggesting proximal stenosis.  Retrograde Park in the left vertebral artery consistent with left subclavian steal syndrome.  Echo 01/2015 Left ventricle: The cavity size was normal. Wall thickness was  normal. Systolic function was normal. The estimated ejection  fraction was in the range of 50% to 55%. Wall motion was normal;  there were no regional wall motion abnormalities. Left  ventricular diastolic function parameters were normal.   ASSESSMENT & PLAN:    1. Paroxysmal atrial fibrillation No recurrence since 2016.  CHA2DS2-VASc score of 1 for sex.  Continue aspirin and statin therapy.  2.  Left subclavian artery stenosis -CT scan October 2020 showed occlusion of left subclavian artery stenosis without significant carotid artery disease bilaterally. Seen by Dr. Donnetta Hutching 01/2019 and did not recommended follow up scan unless symptomatic.   3. HLD -LDL 87 on October 2020.  Continue Lipitor.  Followed by PCP.  4.  Elevated blood pressure reading -Initial blood pressure of 142/90.  Repeat check at the end of visit by myself 130/78. -Advised to periodically check blood pressure.  5.  Tobacco smoking -Recommended smoking cessation. -Does not want to try nicotine patch or gum -She is  thinking about Chantix and let us know if interested  6.  Exertional shortness of breath -Likely due to ongoing smoking.  No exertional chest pressure or tightness.  She has significant cardiac risk factors.  She will let us know if worsening of symptoms.  Discussed in length.    Medication  Adjustments/Labs and Tests Ordered: Current medicines are reviewed at length with the patient today.  Concerns regarding medicines are outlined above.  Medication changes, Labs and Tests ordered today are listed in the Patient Instructions below. Patient Instructions  Medication Instructions:  Your physician recommends that you continue on your current medications as directed. Please refer to the Current Medication list given to you today.  *If you need a refill on your cardiac medications before your next appointment, please call your pharmacy*   Lab Work: None ordered  If you have labs (blood work) drawn today and your tests are completely normal, you will receive your results only by: Marland Kitchen MyChart Message (if you have MyChart) OR . A paper copy in the mail If you have any lab test that is abnormal or we need to change your treatment, we will call you to review the results.   Testing/Procedures: None ordered   Follow-Up: At [facility], you and your health needs are our priority.  As part of our continuing mission to provide you with exceptional heart care, we have created designated Provider Care Teams.  These Care Teams include your primary Cardiologist (physician) and Advanced Practice Providers (APPs -  Physician Assistants and Nurse Practitioners) who all work together to provide you with the care you need, when you need it.  We recommend signing up for the patient portal called "MyChart".  Sign up information is provided on this After Visit Summary.  MyChart is used to connect with patients for Virtual Visits (Telemedicine).  Patients are able to view lab/test results, encounter notes, upcoming appointments, etc.  Non-urgent messages can be sent to your provider as well.   To learn more about what you can do with MyChart, go to [email or web address].    Your next appointment:   6 month(s)  The format for your next appointment:   In Person  Provider:   You may see Dr.  Mertie Moores  or one of the following Advanced Practice Providers on your designated Care Team:    Richardson Dopp, PA-C  Robbie Lis, PA-C    Other Instructions      Signed, Leanor Kail, [place]  07/27/2019 4:07 PM    [address], [place], [place]  [address] Phone: [phone]; Fax: [phone]

## 2019-07-27 NOTE — Patient Instructions (Addendum)
Medication Instructions:  Your physician recommends that you continue on your current medications as directed. Please refer to the Current Medication list given to you today.  *If you need a refill on your cardiac medications before your next appointment, please call your pharmacy*   Lab Work: None ordered  If you have labs (blood work) drawn today and your tests are completely normal, you will receive your results only by: . MyChart Message (if you have MyChart) OR . A paper copy in the mail If you have any lab test that is abnormal or we need to change your treatment, we will call you to review the results.   Testing/Procedures: None ordered   Follow-Up: At [facility], you and your health needs are our priority.  As part of our continuing mission to provide you with exceptional heart care, we have created designated Provider Care Teams.  These Care Teams include your primary Cardiologist (physician) and Advanced Practice Providers (APPs -  Physician Assistants and Nurse Practitioners) who all work together to provide you with the care you need, when you need it.  We recommend signing up for the patient portal called "MyChart".  Sign up information is provided on this After Visit Summary.  MyChart is used to connect with patients for Virtual Visits (Telemedicine).  Patients are able to view lab/test results, encounter notes, upcoming appointments, etc.  Non-urgent messages can be sent to your provider as well.   To learn more about what you can do with MyChart, go to [email or web address].    Your next appointment:   6 month(s)  The format for your next appointment:   In Person  Provider:   You may see Dr. Philip Nahser or one of the following Advanced Practice Providers on your designated Care Team:    Scott Weaver, PA-C  Vin Bhagat, PA-C    Other Instructions   

## 2019-11-08 ENCOUNTER — Inpatient Hospital Stay: Payer: BC Managed Care – PPO | Attending: Medical

## 2019-11-08 ENCOUNTER — Other Ambulatory Visit: Payer: Self-pay

## 2019-11-08 DIAGNOSIS — Z23 Encounter for immunization: Secondary | ICD-10-CM

## 2019-11-08 NOTE — Progress Notes (Addendum)
COVID-19 Vaccine Information can be found at: [email or web address] For questions related to vaccine distribution or appointments, please email [email or web address] or call [phone].     Covid-19 Vaccination Clinic  Name:  Holly Park    MRN: [id number] DOB: 1960/09/24  11/08/2019  Patient was observed post Covid-19 immunization for 15 minutes . No reaction noted. Patient was instructed to call 911 with any severe reactions post vaccine:  Difficulty breathing   Swelling of face and throat   A fast heartbeat   A bad rash all over body  Dizziness and weakness   Immunizations Administered    Name Date Dose VIS Date Route   Pfizer COVID-19 Vaccine 11/08/2019 10:25 AM 0.3 mL 05/17/2018 Intramuscular   Manufacturer: [employer], [employer]   Lot: [id number]   NDC: [address]-5

## 2019-11-29 ENCOUNTER — Inpatient Hospital Stay: Payer: BC Managed Care – PPO

## 2019-12-20 ENCOUNTER — Other Ambulatory Visit: Payer: Self-pay | Admitting: Family Medicine

## 2019-12-20 ENCOUNTER — Ambulatory Visit
Admission: RE | Admit: 2019-12-20 | Discharge: 2019-12-20 | Disposition: A | Discharge status: Home / Self Care | Payer: BC Managed Care – PPO | Source: Ambulatory Visit | Attending: Family Medicine | Admitting: Family Medicine

## 2019-12-20 ENCOUNTER — Other Ambulatory Visit: Payer: Self-pay

## 2019-12-20 DIAGNOSIS — M25551 Pain in right hip: Secondary | ICD-10-CM

## 2021-04-13 NOTE — Progress Notes (Signed)
Cardiology Office Note   Date:  04/14/2021   ID:  Holly Park, DOB 10-06-1960, MRN [id number]  PCP:  Wilfrid Lund, PA  Cardiologist:   Kristeen Miss, MD   Chief Complaint  Patient presents with   Atrial Fibrillation        Problem list 1. Paroxysmal atrial fibrillation  Holly Park is a 61 y.o. female who presents for further evaluation of her paroxysmal atrial fibrillation. [name] was recently seen in the emergency room with an episode of tachycardia. She was found have rapid atrial fibrillation.   She has had problems with bronchitis . Has been found to have an elevated WBC.      She received one dose of IV Cardizem and converted to normal sinus rhythm.  Has been placed on anxiety meds and is feeling better.   She has had PAF in the past .   She's been tried on metoprolol in the past but it caused her heart rate to go down to the 40s.  Feb. 16, 2017:  Doing great.   Has really cut back on her smoking . Is now retired -as of  16 days ago .   Walks regularly   May 12, 2018:  Holly Park is seen back after a 2 year absence.  She has a history of paroxysmal atrial fibrillation. Was found to have carotid bruit. Also has a lower BP in her left arm Has seen Gretta Began, MD  She is trying to stop smoking.   She has bought nicotine patches.  Her sister has had a large ant MI and is now  He is planning on doing a CT angiogram   Last lipid levels show elevated triglyeride level Chol leves   Jan. 23, 2023 Holly Park is seen today for follow up of her PAF and PAF I last saw her in Feb. 2020 She was seen by vin bhagat in 07-24-19, was still smoking  Her husband died in 2019-07-24 of glioblastoma  She increased her smoking to 2 ppd,   She is very depressed, still very emotional about his death , She is committed to being better to hersefl  Has 2 daughters, 35 and  41. She helps take care of her grandchildren   She has a left subclavian stenosis - has been followed by Dr[facility]  [facility]bie Cookey at [facility] . She needs to follow up with [facility]   She needs to have a R hip replacement .    She is at low risk from a cardiac standpoint for her upcoming hip replacement .   Past Medical History:  Diagnosis Date   Atrial fibrillation with RVR ([facility])    Bronchitis    Chest congestion    Chest tightness    Diabetes mellitus without complication ([facility])    Hyperlipidemia    New onset a-fib ([facility])    PAC (premature atrial contraction)    Tachycardia     Past Surgical History:  Procedure Laterality Date   NO PAST SURGERIES       Current Outpatient Medications  Medication Sig Dispense Refill   aspirin EC 81 MG tablet Take 81 mg by mouth daily.     atorvastatin (LIPITOR) 20 MG tablet Take 20 mg by mouth daily.  1   buPROPion (WELLBUTRIN XL) 150 MG 24 hr tablet Take 150 mg by mouth every morning.     escitalopram (LEXAPRO) 10 MG tablet Take 20 mg by mouth daily.  5   fenofibrate (TRICOR) 145 MG tablet Take  1 tablet (145 mg total) by mouth daily. 90 tablet 3   fluticasone (FLONASE) 50 MCG/ACT nasal spray SMARTSIG:2 Spray(s) Both Nares Every Night     levocetirizine (XYZAL) 5 MG tablet Take 5 mg by mouth every evening.   1   meloxicam (MOBIC) 7.5 MG tablet Take 7.5 mg by mouth daily.     ONE TOUCH ULTRA TEST test strip      No current facility-administered medications for this visit.    Allergies:   Metoprolol tartrate    Social History:  The patient  reports that she has been smoking cigarettes. She has a 20.00 pack-year smoking history. She has never used smokeless tobacco. She reports current alcohol use. She reports that she does not use drugs.   Family History:  The patient's family history includes Heart disease in her mother and sister.    ROS:  Please see the history of present illness.    Physical Exam: Blood pressure (!) 142/88, pulse 79, height 5\' 2"  (1.575 m), weight 176 lb 9.6 oz (80.1 kg), SpO2 97 %.  GEN:  Well nourished, well developed in no acute  distress HEENT: Normal NECK: No JVD; No carotid bruits LYMPHATICS: No lymphadenopathy CARDIAC: RRR , no murmurs, rubs, gallops RESPIRATORY:  Clear to auscultation without rales, wheezing or rhonchi  ABDOMEN: Soft, non-tender, non-distended MUSCULOSKELETAL:  No edema; No deformity  SKIN: Warm and dry NEUROLOGIC:  Alert and oriented x 3   EKG:    Jan. 23, 2023 NSR at 79,  no ST or T wave changes.    Recent Labs: No results found for requested labs within last 8760 hours.    Lipid Panel No results found for: CHOL, TRIG, HDL, CHOLHDL, VLDL, LDLCALC, LDLDIRECT    Wt Readings from Last 3 Encounters:  04/14/21 176 lb 9.6 oz (80.1 kg)  07/27/19 177 lb 12.8 oz (80.6 kg)  05/12/18 176 lb 12.8 oz (80.2 kg)      Other studies Reviewed: Additional studies/ records that were reviewed today include: . Review of the above records demonstrates:    ASSESSMENT AND PLAN:  1.  Paroxysmal atrial fibrillation:  no evidence of recurrent Afib . She is at low risk for her hip replacement from a cardiac standpoint   2.  Peripheral vascular disease: she needs to follow up with [facility].   Dr. 05/14/18 has retired.    3.  Hyperlipidemia: lipids have been managed by her primary MD     Current medicines are reviewed at length with the patient today.  The patient does not have concerns regarding medicines.  The following changes have been made:  no change  Labs/ tests ordered today include:   Orders Placed This Encounter  Procedures   EKG 12-Lead    Disposition:   FU with Arbie Cookey in 1 year    Chelsea Aus, MD  04/14/2021 4:48 PM    [facility] [address] [place], [place], [place]  [place] Phone: [phone]; Fax: [phone]   [facility]  [address] [place], [place]  [place] [phone]   Fax [phone]

## 2021-04-14 ENCOUNTER — Encounter: Payer: Self-pay | Admitting: Cardiovascular Disease

## 2021-04-14 ENCOUNTER — Other Ambulatory Visit: Payer: Self-pay

## 2021-04-14 ENCOUNTER — Ambulatory Visit: Payer: BC Managed Care – PPO | Admitting: Cardiovascular Disease

## 2021-04-14 VITALS — BP 142/88 | HR 79 | Ht 62.0 in | Wt 176.6 lb

## 2021-04-14 DIAGNOSIS — I480 Paroxysmal atrial fibrillation: Secondary | ICD-10-CM

## 2021-04-14 DIAGNOSIS — I771 Stricture of artery: Secondary | ICD-10-CM | POA: Diagnosis not present

## 2021-04-14 NOTE — Patient Instructions (Addendum)
Medication Instructions:  Your physician recommends that you continue on your current medications as directed. Please refer to the Current Medication list given to you today.  *If you need a refill on your cardiac medications before your next appointment, please call your pharmacy*   Lab Work: None ordered  If you have labs (blood work) drawn today and your tests are completely normal, you will receive your results only by: . MyChart Message (if you have MyChart) OR . A paper copy in the mail If you have any lab test that is abnormal or we need to change your treatment, we will call you to review the results.   Testing/Procedures: None ordered   Follow-Up: At [facility], you and your health needs are our priority.  As part of our continuing mission to provide you with exceptional heart care, we have created designated Provider Care Teams.  These Care Teams include your primary Cardiologist (physician) and Advanced Practice Providers (APPs -  Physician Assistants and Nurse Practitioners) who all work together to provide you with the care you need, when you need it.  We recommend signing up for the patient portal called "MyChart".  Sign up information is provided on this After Visit Summary.  MyChart is used to connect with patients for Virtual Visits (Telemedicine).  Patients are able to view lab/test results, encounter notes, upcoming appointments, etc.  Non-urgent messages can be sent to your provider as well.   To learn more about what you can do with MyChart, go to [email or web address].    Your next appointment:   12 month(s)  The format for your next appointment:   In Person  Provider:   Vin Bhagat, PA-C   Other Instructions   

## 2021-10-10 ENCOUNTER — Telehealth: Payer: Self-pay | Admitting: *Deleted

## 2021-10-10 NOTE — Telephone Encounter (Signed)
   Pre-operative Risk Assessment    Patient Name: Holly Park  DOB: July 05, 1960 MRN: [id number]      Request for Surgical Clearance    Procedure:   RIGHT TOTAL HIP ARTHROPLASTY  Date of Surgery:  Clearance 12/18/21                                 Surgeon:  DR. MATTHEW OLIN Surgeon's Group or Practice Name:  [facility] Phone number:  [phone] Fax number:  [phone]   Type of Clearance Requested:   - Medical  - Pharmacy:  Hold Aspirin NOT INDICATED   Type of Anesthesia:  Spinal   Additional requests/questions:    [name]   10/10/2021, 10:37 AM

## 2021-10-10 NOTE — Telephone Encounter (Signed)
Primary Cardiologist:Philip Nahser, MD   Preoperative team, please contact this patient and set up a phone call appointment for further preoperative risk assessment. Please obtain consent and complete medication review. Thank you for your help.   She may hold aspirin for 7 days for upcoming procedure with the exception of new cardiac symptoms or intervention which can be assessed at the time of the phone call.    Levi Aland, NP-C    10/10/2021, 4:39 PM [facility] [address] Office [phone] Fax [phone]

## 2021-10-13 ENCOUNTER — Telehealth: Payer: Self-pay | Admitting: *Deleted

## 2021-10-13 NOTE — Telephone Encounter (Signed)
Pt agreeable to plan of care for tele pre op appt 11/20/21 @ 9:40. Med rec and consent are done.     Patient Consent for Virtual Visit        Holly Park has provided verbal consent on 10/13/2021 for a virtual visit (video or telephone).   CONSENT FOR VIRTUAL VISIT FOR:  Holly Park  By participating in this virtual visit I agree to the following:  I hereby voluntarily request, consent and authorize [facility] and its employed or contracted physicians, physician assistants, nurse practitioners or other licensed health care professionals (the Practitioner), to provide me with telemedicine health care services (the "Services") as deemed necessary by the treating Practitioner. I acknowledge and consent to receive the Services by the Practitioner via telemedicine. I understand that the telemedicine visit will involve communicating with the Practitioner through live audiovisual communication technology and the disclosure of certain medical information by electronic transmission. I acknowledge that I have been given the opportunity to request an in-person assessment or other available alternative prior to the telemedicine visit and am voluntarily participating in the telemedicine visit.  I understand that I have the right to withhold or withdraw my consent to the use of telemedicine in the course of my care at any time, without affecting my right to future care or treatment, and that the Practitioner or I may terminate the telemedicine visit at any time. I understand that I have the right to inspect all information obtained and/or recorded in the course of the telemedicine visit and may receive copies of available information for a reasonable fee.  I understand that some of the potential risks of receiving the Services via telemedicine include:  Delay or interruption in medical evaluation due to technological equipment failure or disruption; Information transmitted may not be sufficient (e.g.  poor resolution of images) to allow for appropriate medical decision making by the Practitioner; and/or  In rare instances, security protocols could fail, causing a breach of personal health information.  Furthermore, I acknowledge that it is my responsibility to provide information about my medical history, conditions and care that is complete and accurate to the best of my ability. I acknowledge that Practitioner's advice, recommendations, and/or decision may be based on factors not within their control, such as incomplete or inaccurate data provided by me or distortions of diagnostic images or specimens that may result from electronic transmissions. I understand that the practice of medicine is not an exact science and that Practitioner makes no warranties or guarantees regarding treatment outcomes. I acknowledge that a copy of this consent can be made available to me via my patient portal [facility] MyChart), or I can request a printed copy by calling the office of [facility].    I understand that my insurance will be billed for this visit.   I have read or had this consent read to me. I understand the contents of this consent, which adequately explains the benefits and risks of the Services being provided via telemedicine.  I have been provided ample opportunity to ask questions regarding this consent and the Services and have had my questions answered to my satisfaction. I give my informed consent for the services to be provided through the use of telemedicine in my medical care

## 2021-10-13 NOTE — Telephone Encounter (Signed)
Pt agreeable to plan of care for tele pre op appt 11/20/21 @ 9:40. Med rec and consent are done.

## 2021-11-18 ENCOUNTER — Telehealth: Payer: Self-pay | Admitting: Cardiovascular Disease

## 2021-11-18 NOTE — Telephone Encounter (Signed)
Patient is calling wanting to see if her 08/31 appt can be changed to another time due to unexpectedly needing to have her grandchild at the scheduled time. She states she would rather keep the appt than reschedule for another day if a time change is not possible. Please advise.

## 2021-11-18 NOTE — Telephone Encounter (Signed)
I called the pt back and rescheduled her to 11/28/21 @ 3 pm for tele pre op appt.   Pt thanked me for the help.

## 2021-11-20 ENCOUNTER — Telehealth: Payer: BC Managed Care – PPO

## 2021-11-27 NOTE — Telephone Encounter (Signed)
Pt called back and she is agreeable to plan of care that we needed to mover her appt. Pt is very anxious and did not want appt out too far to make sure the surgeon get the notes in time.   Pt has been scheduled 12/04/21 @ 10 am

## 2021-11-27 NOTE — Telephone Encounter (Signed)
Left message for the pt to call back as per the pre op provider we will need to move her appt out a bit due to staffing.

## 2021-11-28 ENCOUNTER — Telehealth: Payer: BC Managed Care – PPO

## 2021-12-04 ENCOUNTER — Ambulatory Visit: Payer: BC Managed Care – PPO | Attending: Internal Medicine | Admitting: Physician Assistant

## 2021-12-04 DIAGNOSIS — Z01810 Encounter for preprocedural cardiovascular examination: Secondary | ICD-10-CM

## 2021-12-04 NOTE — Progress Notes (Signed)
Virtual Visit via Telephone Note   Because of Holly Park co-morbid illnesses, she is at least at moderate risk for complications without adequate follow up.  This format is felt to be most appropriate for this patient at this time.  The patient did not have access to video technology/had technical difficulties with video requiring transitioning to audio format only (telephone).  All issues noted in this document were discussed and addressed.  No physical exam could be performed with this format.  Please refer to the patient's chart for her consent to telehealth for [facility].  Evaluation Performed:  Preoperative cardiovascular risk assessment _____________   Date:  12/04/2021   Patient ID:  Holly Park, DOB 12-12-1960, MRN [id number] Patient Location:  Home Provider location:   Office  Primary Care Provider:  Wilfrid Lund, PA Primary Cardiologist:  Kristeen Miss, MD  Chief Complaint / Patient Profile   61 y.o. y/o female with a h/o remote PAF 2016, DM, HLD, PACs, left subclavian stenosis/subclavian steal (previously followed by [facility]) who is pending RIGHT TOTAL HIP ARTHROPLASTY and presents today for telephonic preoperative cardiovascular risk assessment.  Past Medical History    Past Medical History:  Diagnosis Date   Atrial fibrillation with RVR (HCC)    Bronchitis    Chest congestion    Chest tightness    Diabetes mellitus without complication (HCC)    Hyperlipidemia    New onset a-fib (HCC)    PAC (premature atrial contraction)    Tachycardia    Past Surgical History:  Procedure Laterality Date   NO PAST SURGERIES      Allergies  Allergies  Allergen Reactions   Metoprolol Tartrate     DROPPED HEART RATE INTO FOURTY'S.    History of Present Illness    Holly Park is a 61 y.o. female who presents via audio/video conferencing for a telehealth visit today.  Pt was last seen in cardiology clinic on 04/14/21 by Dr. Elease Hashimoto.  At that time  Holly Park was doing well and cleared for hip surgery though visit was >6 months ago and procedure just wasn't scheduled yet at at that time. Remote echo 2016 EF 50-55% no significant abnormality otherwise. Has h/o L subclavian stenosis/subclavian steal previously followed by Dr. Arbie Cookey. The patient is now pending procedure as outlined above. Since her last visit, she denies any CP, SOB, presyncope and syncope. She reports chronic hx of intermittent mild dizziness which has been unchanged for many years - was told in the past it was related to her subclavian disease and states she was told they would only intervene if it progressed which it hasn't. No symptoms of presyncope or syncope lifting arm over her head. She is aware to get her BP checked in R arm.   Home Medications    Prior to Admission medications   Medication Sig Start Date End Date Taking? Authorizing Provider  aspirin EC 81 MG tablet Take 81 mg by mouth daily.    [provider]  atorvastatin (LIPITOR) 20 MG tablet Take 20 mg by mouth daily. 12/03/17   [provider]  buPROPion (WELLBUTRIN XL) 150 MG 24 hr tablet Take 150 mg by mouth every morning. 01/29/21   [provider]  escitalopram (Holly Park) 10 MG tablet Take 20 mg by mouth daily. 01/15/15   [provider]  fenofibrate (TRICOR) 145 MG tablet Take 1 tablet (145 mg total) by mouth daily. 05/12/18   Pricilla Riffle, MD  fluticasone (  FLONASE) 50 MCG/ACT nasal spray SMARTSIG:2 Spray(s) Both Nares Every Night 12/07/18   [provider]  levocetirizine (XYZAL) 5 MG tablet Take 5 mg by mouth every evening.  11/12/17   [provider]  meloxicam (MOBIC) 7.5 MG tablet Take 7.5 mg by mouth daily. 03/24/21   [provider]  ONE TOUCH ULTRA TEST test strip  02/28/18   [provider]    Physical Exam    Vital Signs:  Holly Park does not have vital signs available for review today.  Given telephonic nature of  communication, physical exam is limited. AAOx3. NAD. Normal affect.  Speech and respirations are unlabored.  Accessory Clinical Findings    None  Assessment & Plan    1.  Preoperative Cardiovascular Risk Assessment: RCRI 0.4% indicating lo CV risk. The patient affirms she has been doing well without any new cardiac symptoms. They are able to achieve 6.61 METS without cardiac limitations. Therefore, based on ACC/AHA guidelines, the patient would be at acceptable risk for the planned procedure without further cardiovascular testing. The patient was advised that if she develops new symptoms prior to surgery to contact our office to arrange for a follow-up visit, and she verbalized understanding . She is not on any anticoagulation aside from ASA. From cardiac standpoint she can hold up to 7 days if needed for surgery. Will defer to surgeon to relay final procedure instructions to patient.  Regarding left subclavian stenosis, recommended she call [facility] to re-establish care with them since Dr. Arbie Cookey retired. Patient has been told in the past to only have her BP checked in the right arm given her left subclavian stenosis.  A copy of this note will be routed to requesting surgeon.  Time:   Today, I have spent 5 minutes with the patient with telehealth technology discussing medical history, symptoms, and management plan.     Laurann Montana, PA-C  12/04/2021, 9:38 AM

## 2021-12-12 ENCOUNTER — Other Ambulatory Visit: Payer: Self-pay | Admitting: Family Medicine

## 2021-12-12 ENCOUNTER — Ambulatory Visit
Admission: RE | Admit: 2021-12-12 | Discharge: 2021-12-12 | Disposition: A | Discharge status: Home / Self Care | Payer: BC Managed Care – PPO | Source: Ambulatory Visit | Attending: Family Medicine | Admitting: Family Medicine

## 2021-12-12 DIAGNOSIS — Z01818 Encounter for other preprocedural examination: Secondary | ICD-10-CM

## 2021-12-12 DIAGNOSIS — F17200 Nicotine dependence, unspecified, uncomplicated: Secondary | ICD-10-CM

## 2022-04-28 ENCOUNTER — Ambulatory Visit
Admission: RE | Admit: 2022-04-28 | Discharge: 2022-04-28 | Disposition: A | Discharge status: Home / Self Care | Payer: BC Managed Care – PPO | Source: Ambulatory Visit | Attending: Family Medicine | Admitting: Family Medicine

## 2022-04-28 ENCOUNTER — Encounter: Payer: Self-pay | Admitting: Family Medicine

## 2022-04-28 DIAGNOSIS — F17200 Nicotine dependence, unspecified, uncomplicated: Secondary | ICD-10-CM

## 2022-11-06 ENCOUNTER — Other Ambulatory Visit: Payer: Self-pay | Admitting: Family Medicine

## 2022-11-06 DIAGNOSIS — R911 Solitary pulmonary nodule: Secondary | ICD-10-CM

## 2022-11-17 ENCOUNTER — Encounter: Payer: Self-pay | Admitting: Family Medicine

## 2022-11-25 ENCOUNTER — Ambulatory Visit
Admission: RE | Admit: 2022-11-25 | Discharge: 2022-11-25 | Disposition: A | Discharge status: Home / Self Care | Payer: BC Managed Care – PPO | Source: Ambulatory Visit | Attending: Family Medicine | Admitting: Family Medicine

## 2022-11-25 ENCOUNTER — Other Ambulatory Visit: Payer: Self-pay | Admitting: Family Medicine

## 2022-11-25 DIAGNOSIS — R911 Solitary pulmonary nodule: Secondary | ICD-10-CM

## 2024-03-02 ENCOUNTER — Other Ambulatory Visit: Payer: Self-pay | Admitting: Family Medicine

## 2024-03-02 DIAGNOSIS — F17200 Nicotine dependence, unspecified, uncomplicated: Secondary | ICD-10-CM

## 2024-03-10 ENCOUNTER — Encounter: Payer: Self-pay | Admitting: Family Medicine

## 2024-03-22 ENCOUNTER — Other Ambulatory Visit: Payer: Self-pay

## 2024-03-31 ENCOUNTER — Ambulatory Visit
Admission: RE | Admit: 2024-03-31 | Discharge: 2024-03-31 | Disposition: A | Source: Ambulatory Visit | Attending: Family Medicine | Admitting: Family Medicine

## 2024-03-31 DIAGNOSIS — F17200 Nicotine dependence, unspecified, uncomplicated: Secondary | ICD-10-CM

## 2024-04-25 ENCOUNTER — Telehealth (HOSPITAL_BASED_OUTPATIENT_CLINIC_OR_DEPARTMENT_OTHER): Payer: Self-pay

## 2024-04-25 NOTE — Telephone Encounter (Signed)
"  ° °  Pre-operative Risk Assessment    Patient Name: Holly Park  DOB: 25-May-1960 MRN: [id number]   Date of last office visit: 12/04/2021 with Dayna Dunn, PA-C (appointment with [facility] 08/04/2023) Date of next office visit: None  Request for Surgical Clearance    Procedure:  Left total hip arthroplasty   Date of Surgery:  Clearance 07/14/24                                 Surgeon:  Dr. Donnice Car Surgeon's Group or Practice Name:  [facility] Phone number:  [phone] Fax number:  [phone]   Type of Clearance Requested:   - Medical  - Pharmacy:  Hold Aspirin -does not specify   Type of Anesthesia:  Spinal   Additional requests/questions:  None  SignedPatrcia Iverson CROME   04/25/2024, 10:38 AM   "
